# Patient Record
Sex: Female | Born: 1935 | Race: White | Hispanic: No
Health system: Southern US, Community
[De-identification: ages and names within clinical notes are randomized; demographics above are authoritative.]

## PROBLEM LIST (undated history)

## (undated) DIAGNOSIS — F0390 Unspecified dementia without behavioral disturbance: Secondary | ICD-10-CM

## (undated) HISTORY — PX: BRAIN SURGERY: SHX531

---

## 1997-09-10 ENCOUNTER — Ambulatory Visit (HOSPITAL_COMMUNITY): Admission: RE | Admit: 1997-09-10 | Discharge: 1997-09-11 | Payer: Self-pay | Admitting: Ophthalmology

## 1998-01-07 ENCOUNTER — Ambulatory Visit (HOSPITAL_COMMUNITY): Admission: RE | Admit: 1998-01-07 | Discharge: 1998-01-07 | Payer: Self-pay | Admitting: Ophthalmology

## 2000-05-18 ENCOUNTER — Ambulatory Visit (HOSPITAL_COMMUNITY): Admission: RE | Admit: 2000-05-18 | Discharge: 2000-05-18 | Payer: Self-pay | Admitting: Gastroenterology

## 2000-05-19 ENCOUNTER — Ambulatory Visit (HOSPITAL_COMMUNITY): Admission: RE | Admit: 2000-05-19 | Discharge: 2000-05-19 | Payer: Self-pay | Admitting: Gastroenterology

## 2000-05-19 ENCOUNTER — Encounter: Payer: Self-pay | Admitting: Gastroenterology

## 2000-10-17 ENCOUNTER — Other Ambulatory Visit: Admission: RE | Admit: 2000-10-17 | Discharge: 2000-10-17 | Payer: Self-pay | Admitting: *Deleted

## 2002-04-23 ENCOUNTER — Ambulatory Visit (HOSPITAL_COMMUNITY): Admission: RE | Admit: 2002-04-23 | Discharge: 2002-04-23 | Payer: Self-pay | Admitting: Cardiology

## 2003-05-07 ENCOUNTER — Ambulatory Visit (HOSPITAL_COMMUNITY): Admission: RE | Admit: 2003-05-07 | Discharge: 2003-05-07 | Payer: Self-pay | Admitting: Family Medicine

## 2004-07-23 ENCOUNTER — Ambulatory Visit: Payer: Self-pay | Admitting: Cardiology

## 2006-03-16 ENCOUNTER — Ambulatory Visit: Payer: Self-pay | Admitting: Cardiology

## 2006-03-24 ENCOUNTER — Ambulatory Visit (HOSPITAL_COMMUNITY): Admission: RE | Admit: 2006-03-24 | Discharge: 2006-03-24 | Payer: Self-pay | Admitting: Family Medicine

## 2006-04-01 ENCOUNTER — Ambulatory Visit: Payer: Self-pay

## 2007-02-16 ENCOUNTER — Inpatient Hospital Stay (HOSPITAL_COMMUNITY): Admission: EM | Admit: 2007-02-16 | Discharge: 2007-02-21 | Payer: Self-pay | Admitting: Emergency Medicine

## 2007-03-03 ENCOUNTER — Encounter: Admission: RE | Admit: 2007-03-03 | Discharge: 2007-03-03 | Payer: Self-pay | Admitting: Neurosurgery

## 2007-03-07 ENCOUNTER — Inpatient Hospital Stay (HOSPITAL_COMMUNITY): Admission: RE | Admit: 2007-03-07 | Discharge: 2007-03-21 | Payer: Self-pay | Admitting: Neurosurgery

## 2007-03-20 ENCOUNTER — Ambulatory Visit: Payer: Self-pay | Admitting: Physical Medicine & Rehabilitation

## 2007-03-21 ENCOUNTER — Inpatient Hospital Stay (HOSPITAL_COMMUNITY)
Admission: RE | Admit: 2007-03-21 | Discharge: 2007-03-31 | Payer: Self-pay | Admitting: Physical Medicine & Rehabilitation

## 2007-04-17 ENCOUNTER — Emergency Department (HOSPITAL_COMMUNITY): Admission: EM | Admit: 2007-04-17 | Discharge: 2007-04-17 | Payer: Self-pay | Admitting: Emergency Medicine

## 2007-05-02 ENCOUNTER — Encounter: Admission: RE | Admit: 2007-05-02 | Discharge: 2007-05-02 | Payer: Self-pay | Admitting: Neurosurgery

## 2007-07-20 ENCOUNTER — Ambulatory Visit (HOSPITAL_COMMUNITY): Admission: RE | Admit: 2007-07-20 | Discharge: 2007-07-20 | Payer: Self-pay | Admitting: Gastroenterology

## 2007-07-20 ENCOUNTER — Encounter (INDEPENDENT_AMBULATORY_CARE_PROVIDER_SITE_OTHER): Payer: Self-pay | Admitting: Gastroenterology

## 2008-02-21 IMAGING — CT CT HEAD W/O CM
1 of 2 series · 15 of 30 positions shown, 19 images · IV contrast (agent unspecified)
Comparison: CT 03/14/07.

CLINICAL DATA: Followup subdural hemorrhage. Headache.
 HEAD CT WITHOUT CONTRAST:
TECHNIQUE: Contiguous axial images were obtained from the base of the skull through the vertex according to standard protocol without contrast.

[Series 3: recon 2: brain · axial · 0.47mm/px · z∈[+168,+321]mm · 15 of 64 slices shown, 19 images]
[im 4/64  brain]
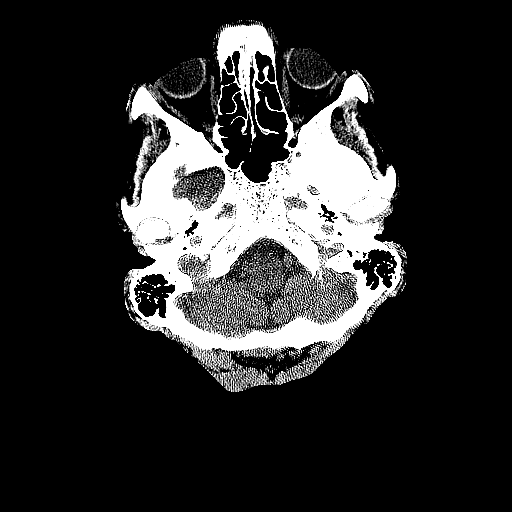
[im 4/64  bone]
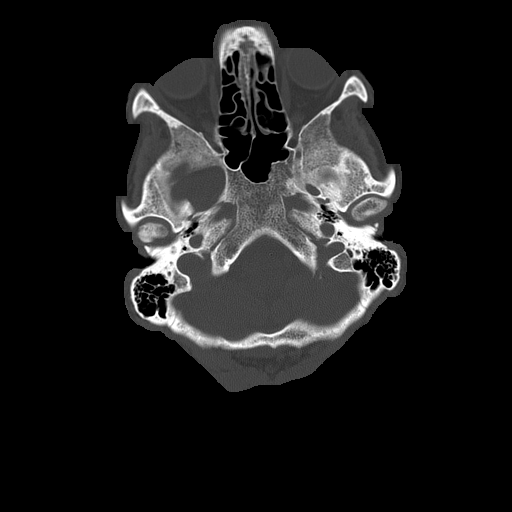
[im 7/64  brain]
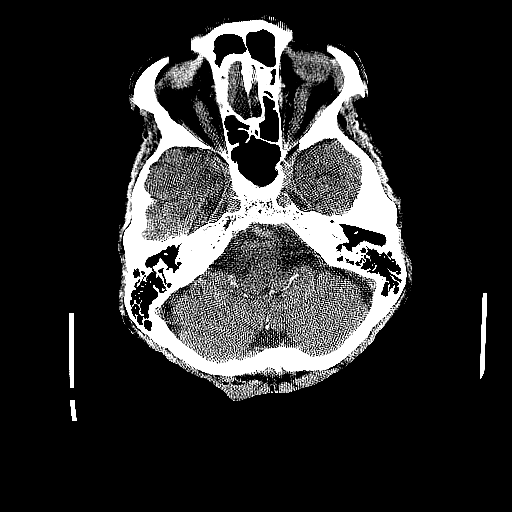
[im 14/64  brain]
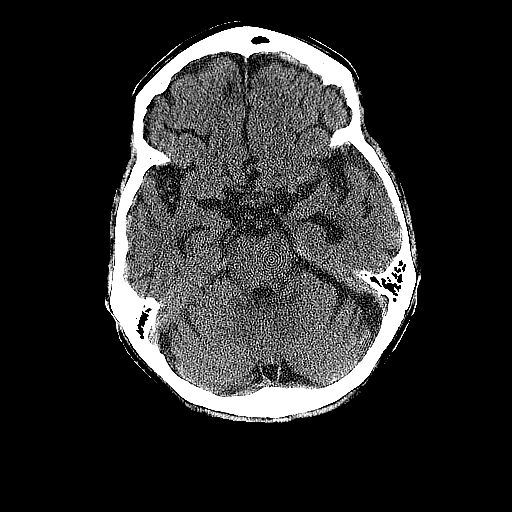
[im 17/64  brain]
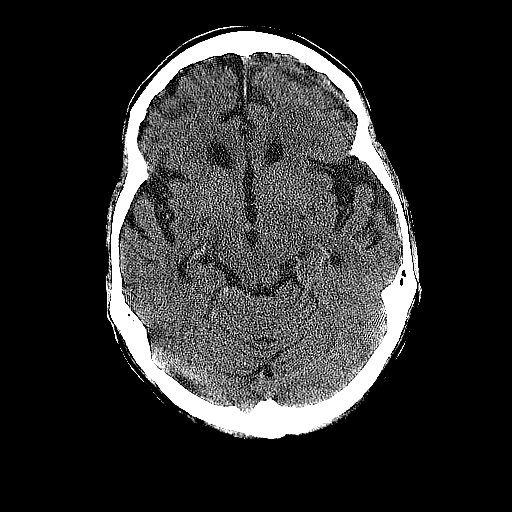
[im 20/64  brain]
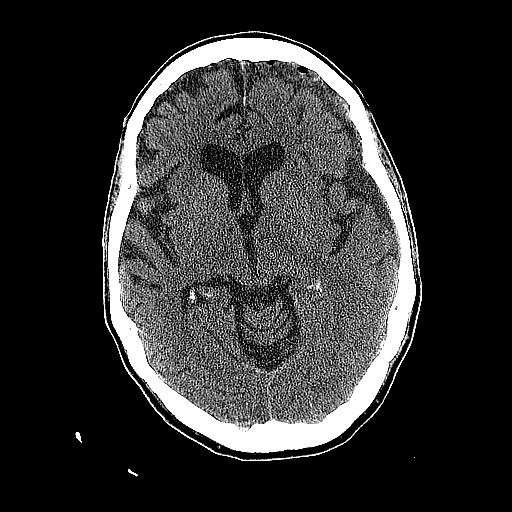
[im 20/64  bone]
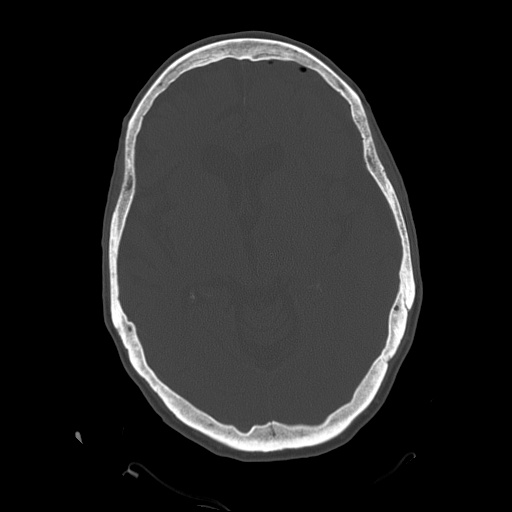
[im 24/64  brain]
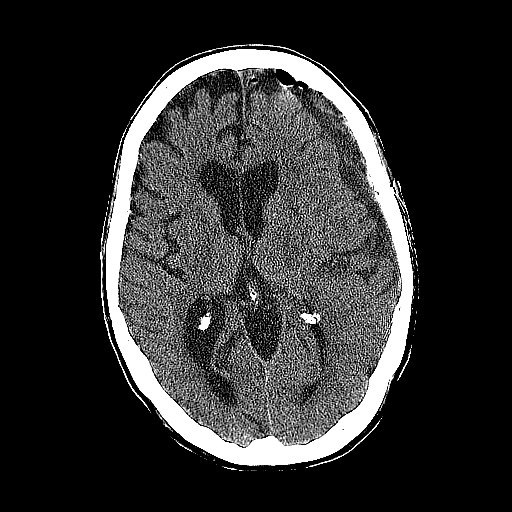
[im 27/64  brain]
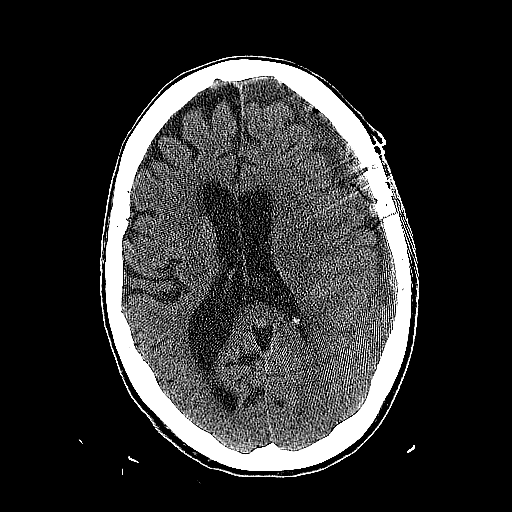
[im 34/64  brain]
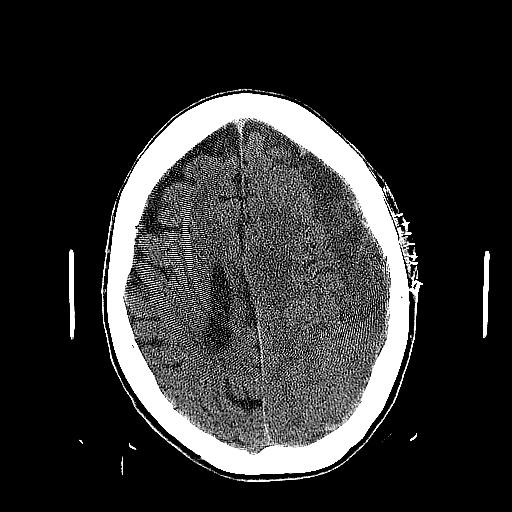
[im 37/64  brain]
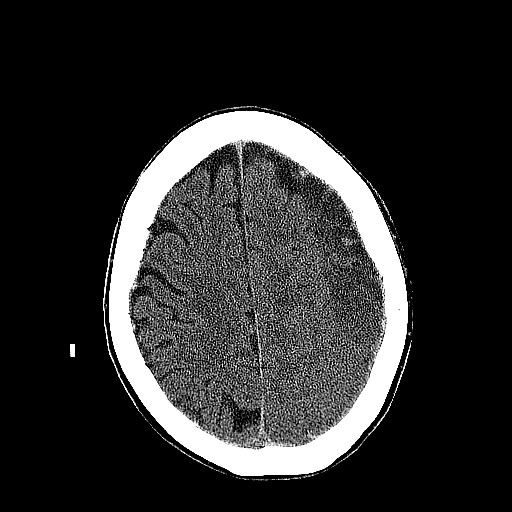
[im 37/64  bone]
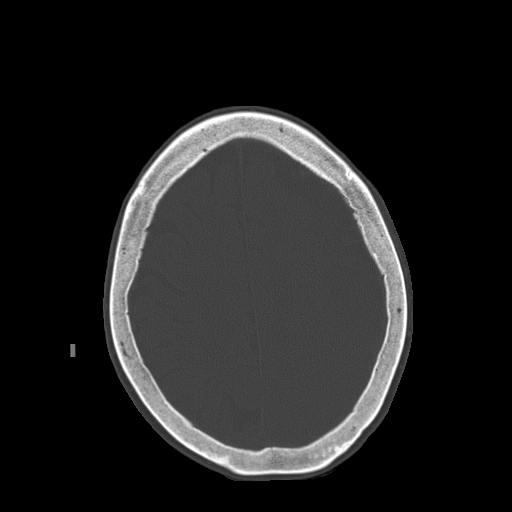
[im 40/64  brain]
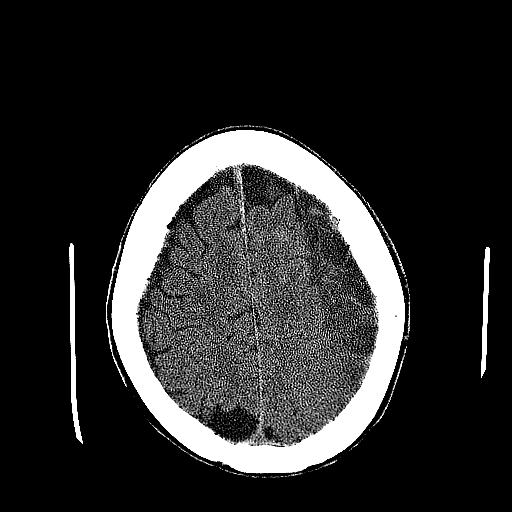
[im 44/64  brain]
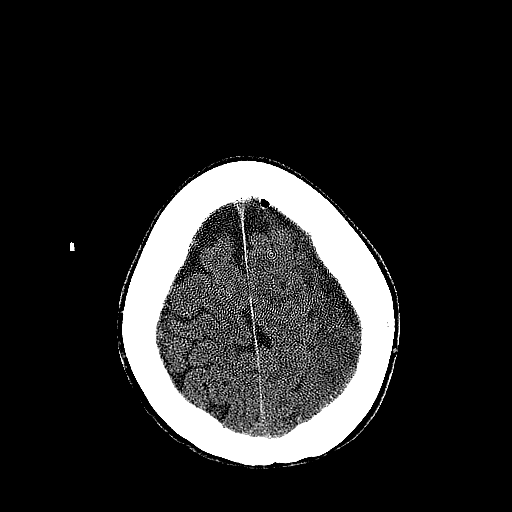
[im 47/64  brain]
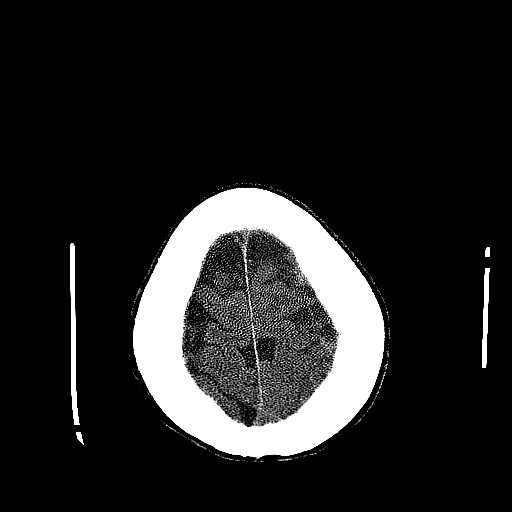
[im 54/64  brain]
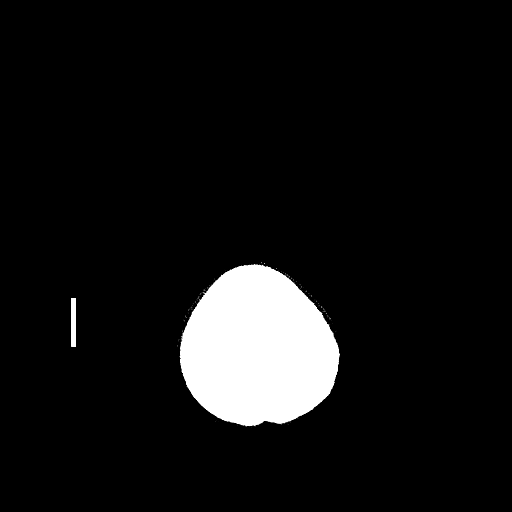
[im 54/64  bone]
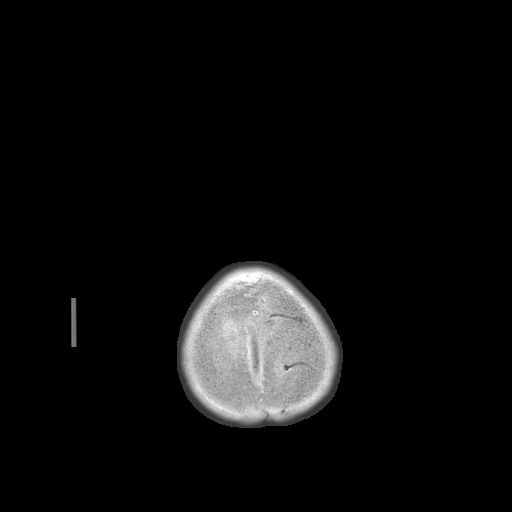
[im 57/64  brain]
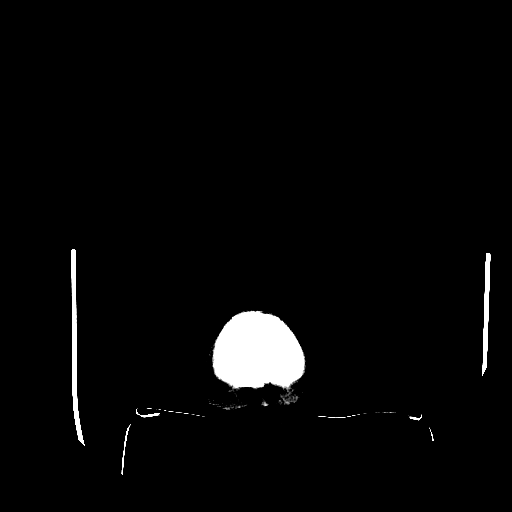
[im 60/64  brain]
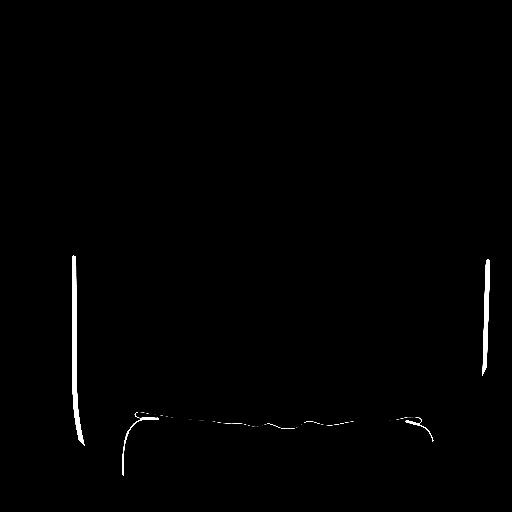

[15 of 30 positions shown; findings below may reference images not displayed]

FINDINGS: There has been left frontal craniotomy for evacuation of subdural hematoma.  There remains a large mixed density subdural hematoma in the left frontal and parietal lobe. This has predominantly low density anteriorly and has some layering higher density blood posteriorly. This fluid collection measures approximately 22 mm in thickness, which is similar to the prior study. There is 6.1 mm of midline shift, which is also similar to the prior study.  No new hemorrhage is seen since the prior CT scan.  There is generalized atrophy, the ventricles are not enlarged. No acute cortical infarct is identified. The prior study showed some acute hemorrhage within the subdural collection, this has resolved.
IMPRESSION: Large mixed density subdural hematoma on the left with 6 mm of midline shift. There is no acute hemorrhage or acute infarct.

## 2008-02-29 IMAGING — CT CT HEAD W/O CM
1 series · 15 of 30 positions shown, 19 images · IV contrast (agent unspecified)
Comparison: 03/21/07.

CLINICAL DATA: History of subdural hematoma; status-post craniotomy.  Increase in lethargy.  
HEAD CT WITHOUT CONTRAST:
TECHNIQUE: Contiguous axial images were obtained from the base of the skull through the vertex according to standard protocol without contrast.

[Series 2: brain · axial · 0.49mm/px · z∈[+115,+261]mm · 15 of 32 slices shown, 19 images]
[im 2/32  brain]
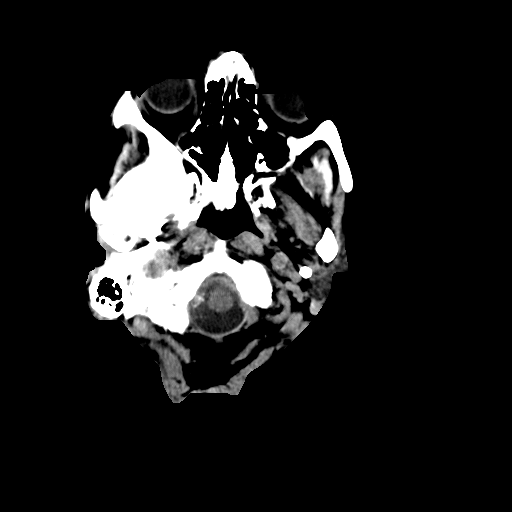
[im 2/32  bone]
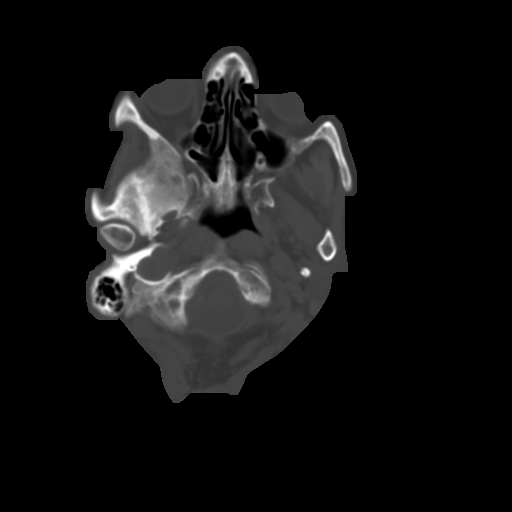
[im 4/32  brain]
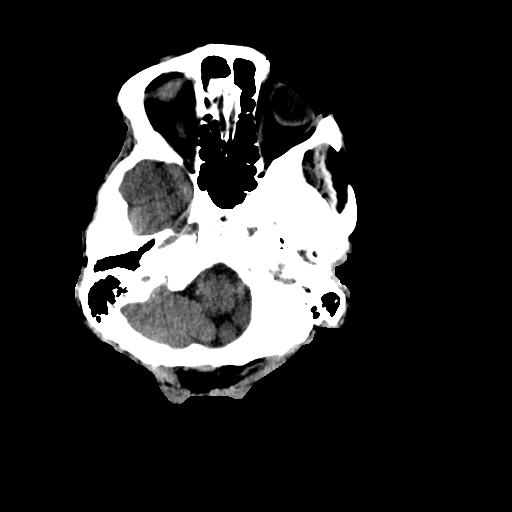
[im 6/32  brain]
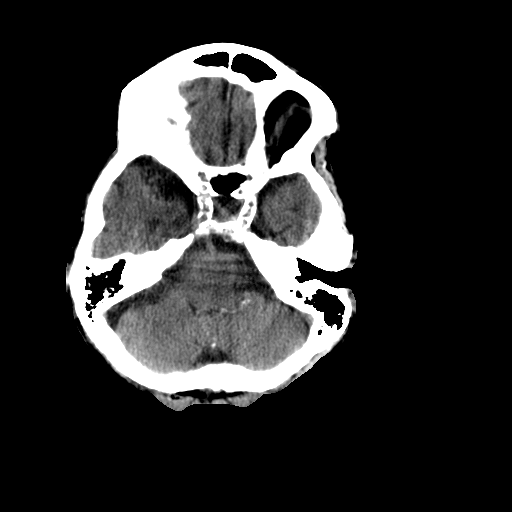
[im 8/32  brain]
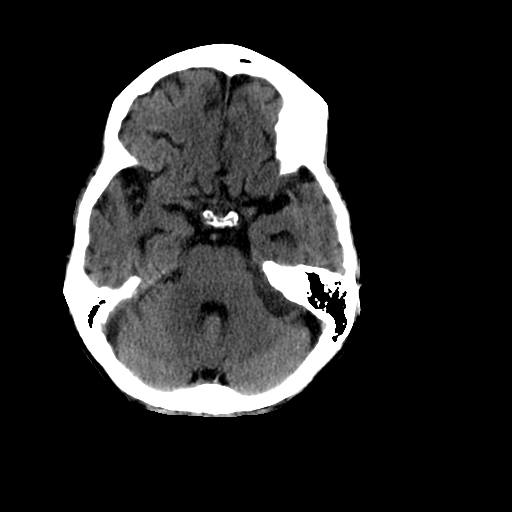
[im 10/32  brain]
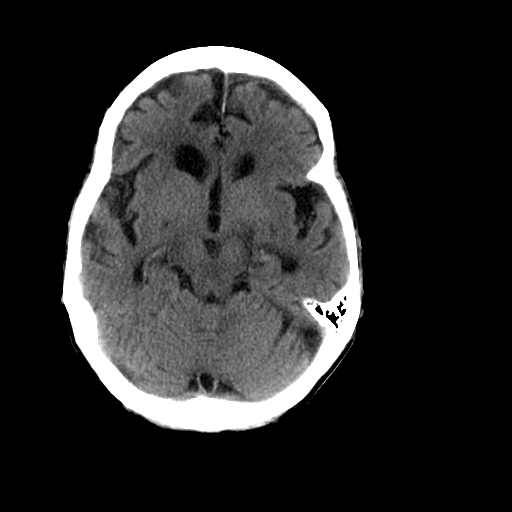
[im 10/32  bone]
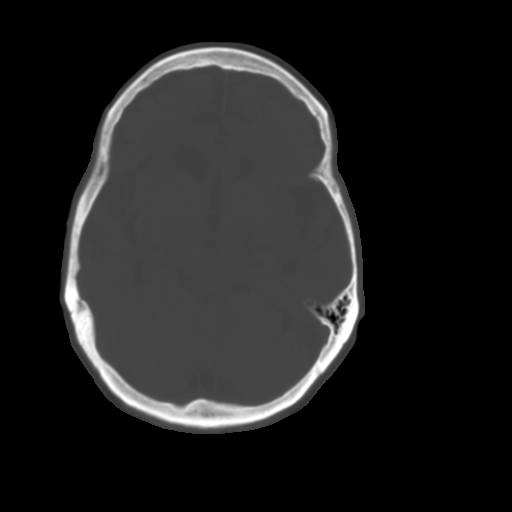
[im 12/32  brain]
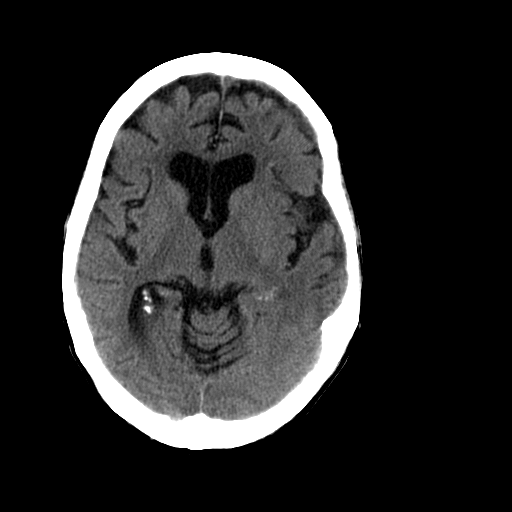
[im 14/32  brain]
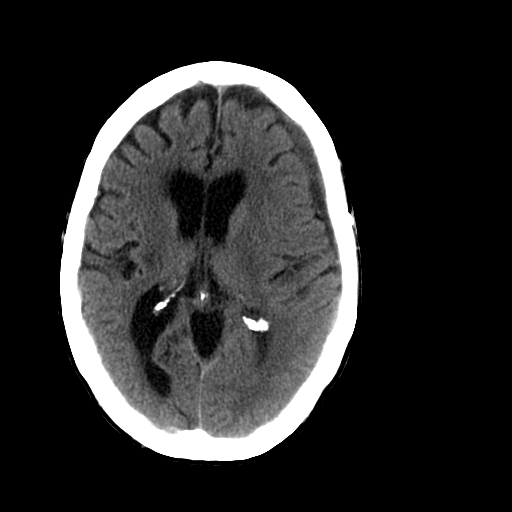
[im 17/32  brain]
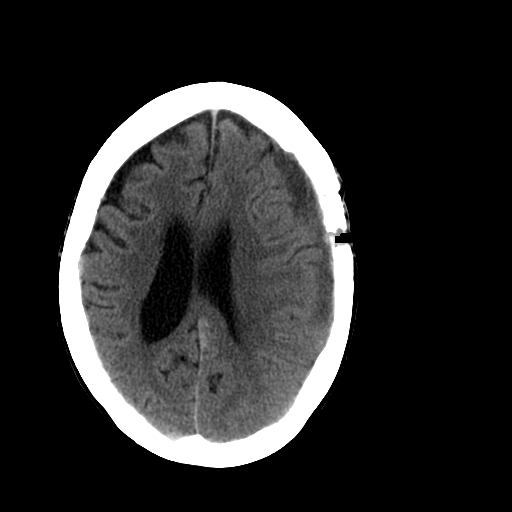
[im 18/32  brain]
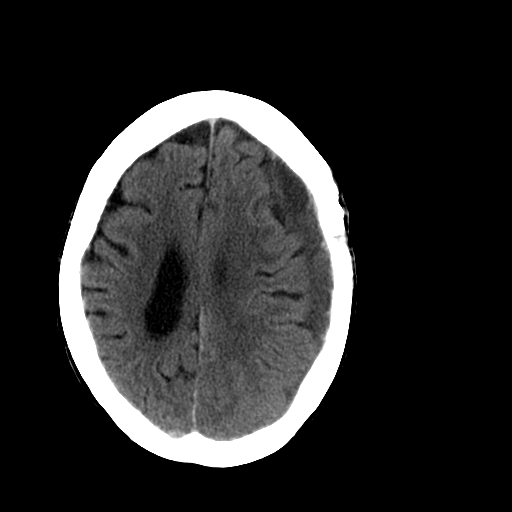
[im 18/32  bone]
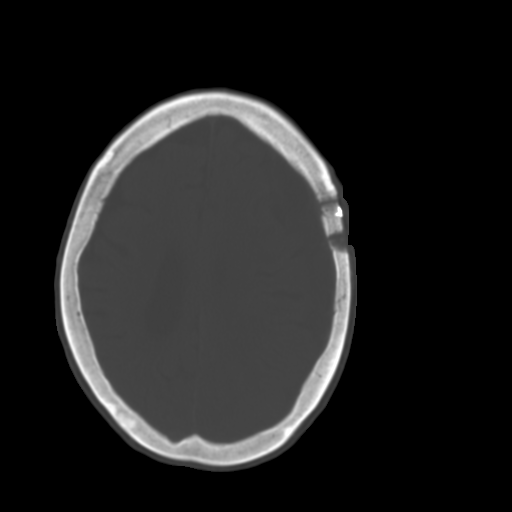
[im 20/32  brain]
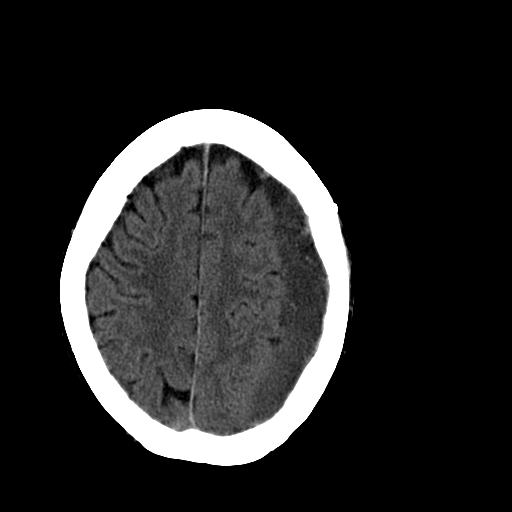
[im 22/32  brain]
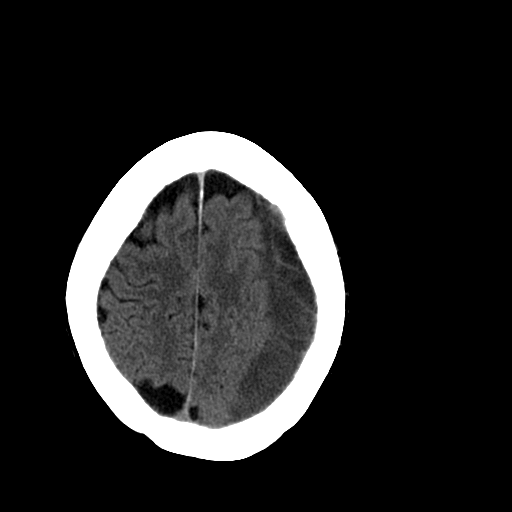
[im 24/32  brain]
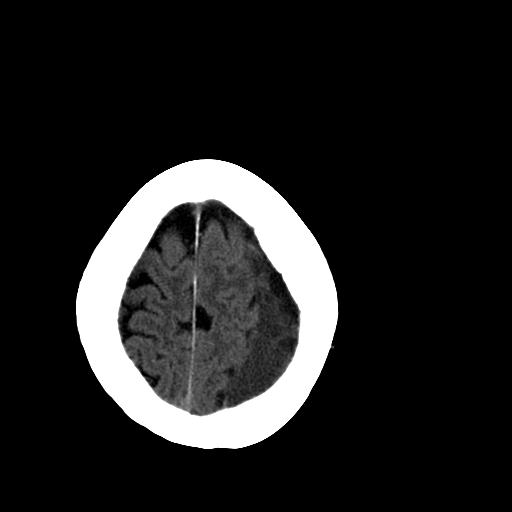
[im 26/32  brain]
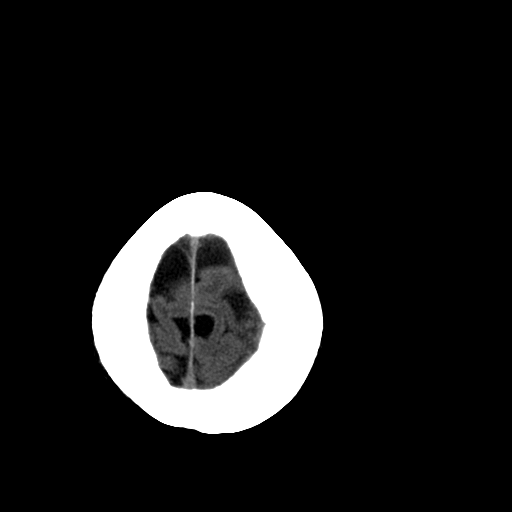
[im 26/32  bone]
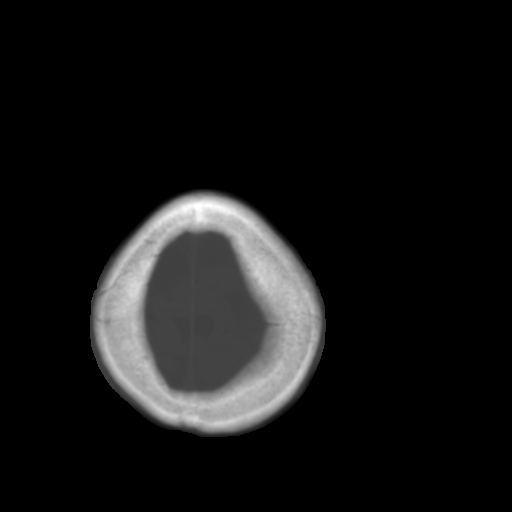
[im 28/32  brain]
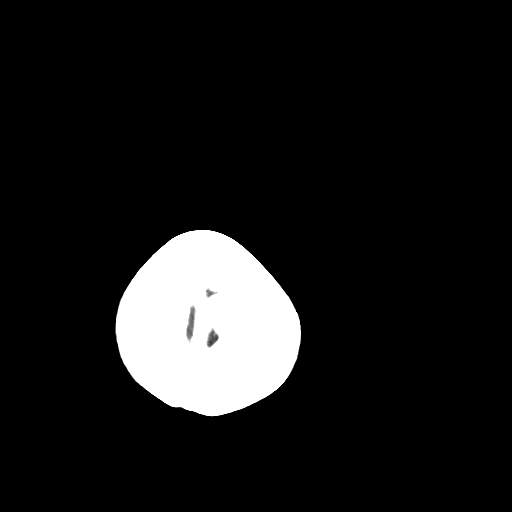
[im 30/32  brain]
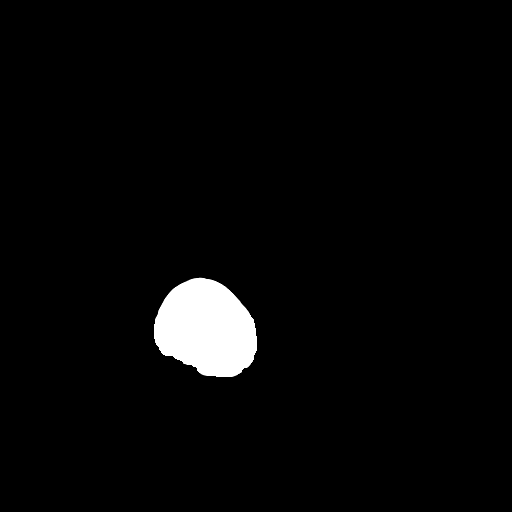

[15 of 30 positions shown; findings below may reference images not displayed]

FINDINGS: Large left-sided subdural hematoma remains with maximal thickness of 2.2 cm with local mass effect upon the adjacent gyri and left lateral ventricle with 5 mm of midline shift towards the right.  The left-sided subdural hematoma has changed in overall consistency which may represent loculation/coagulation of blood.  Superimposed infection cannot be excluded if of clinical concern.  No interval developing large infarct.  Baseline atrophy and small vessel disease-type changes noted.
IMPRESSION: The overall maximal thickness of large left-sided subdural hematoma is similar to that of prior exam measuring 2.2 cm in maximal thickness.  There has been a change in the consistency of the large left-sided hematoma which may represent loculation/coagulation of blood.  Local mass effect with midline shift to the right by 5 mm also similar to that of prior exam.

## 2008-07-18 ENCOUNTER — Observation Stay (HOSPITAL_COMMUNITY): Admission: EM | Admit: 2008-07-18 | Discharge: 2008-07-25 | Payer: Self-pay | Admitting: Emergency Medicine

## 2008-07-18 ENCOUNTER — Ambulatory Visit: Payer: Self-pay | Admitting: Internal Medicine

## 2010-07-29 LAB — DIFFERENTIAL
Basophils Relative: 0 % (ref 0–1)
Eosinophils Relative: 0 % (ref 0–5)
Lymphs Abs: 1.3 10*3/uL (ref 0.7–4.0)
Monocytes Relative: 6 % (ref 3–12)
Neutro Abs: 8.1 10*3/uL — ABNORMAL HIGH (ref 1.7–7.7)
Neutrophils Relative %: 81 % — ABNORMAL HIGH (ref 43–77)

## 2010-07-29 LAB — IRON AND TIBC: UIBC: 241 ug/dL

## 2010-07-29 LAB — COMPREHENSIVE METABOLIC PANEL
ALT: 13 U/L (ref 0–35)
AST: 25 U/L (ref 0–37)
Albumin: 3.2 g/dL — ABNORMAL LOW (ref 3.5–5.2)
Alkaline Phosphatase: 57 U/L (ref 39–117)
Alkaline Phosphatase: 75 U/L (ref 39–117)
BUN: 11 mg/dL (ref 6–23)
CO2: 21 mEq/L (ref 19–32)
Chloride: 104 mEq/L (ref 96–112)
Chloride: 107 mEq/L (ref 96–112)
GFR calc Af Amer: >60 mL/min (ref >60)
GFR calc non Af Amer: >60 mL/min (ref >60)
Glucose, Bld: 89 mg/dL (ref 70–99)
Potassium: 3 mEq/L — ABNORMAL LOW (ref 3.5–5.1)
Potassium: 3.1 mEq/L — ABNORMAL LOW (ref 3.5–5.1)
Total Bilirubin: 0.7 mg/dL (ref 0.3–1.2)
Total Bilirubin: 1.3 mg/dL — ABNORMAL HIGH (ref 0.3–1.2)

## 2010-07-29 LAB — URINALYSIS, ROUTINE W REFLEX MICROSCOPIC
Hgb urine dipstick: NEGATIVE
Ketones, ur: >80 mg/dL — AB
Nitrite: NEGATIVE
Nitrite: POSITIVE — AB
Urobilinogen, UA: 0.2 mg/dL (ref 0.0–1.0)
Urobilinogen, UA: 1 mg/dL (ref 0.0–1.0)
pH: 5.5 (ref 5.0–8.0)

## 2010-07-29 LAB — BLOOD GAS, ARTERIAL
Acid-base deficit: 1.6 mmol/L (ref 0.0–2.0)
Bicarbonate: 21.7 mEq/L (ref 20.0–24.0)
O2 Saturation: 89.3 %
pCO2 arterial: 30.7 mmHg — ABNORMAL LOW (ref 35.0–45.0)
pO2, Arterial: 53.8 mmHg — ABNORMAL LOW (ref 80.0–100.0)

## 2010-07-29 LAB — CBC
HCT: 33.1 % — ABNORMAL LOW (ref 36.0–46.0)
HCT: 33.6 % — ABNORMAL LOW (ref 36.0–46.0)
HCT: 35.1 % — ABNORMAL LOW (ref 36.0–46.0)
HCT: 35.7 % — ABNORMAL LOW (ref 36.0–46.0)
Hemoglobin: 11.3 g/dL — ABNORMAL LOW (ref 12.0–15.0)
Hemoglobin: 11.5 g/dL — ABNORMAL LOW (ref 12.0–15.0)
Hemoglobin: 12.3 g/dL (ref 12.0–15.0)
MCHC: 34.3 g/dL (ref 30.0–36.0)
Platelets: 133 10*3/uL — ABNORMAL LOW (ref 150–400)
Platelets: 145 10*3/uL — ABNORMAL LOW (ref 150–400)
Platelets: 155 10*3/uL (ref 150–400)
Platelets: 207 10*3/uL (ref 150–400)
RBC: 2.82 MIL/uL — ABNORMAL LOW (ref 3.87–5.11)
RBC: 3 MIL/uL — ABNORMAL LOW (ref 3.87–5.11)
RBC: 3.15 MIL/uL — ABNORMAL LOW (ref 3.87–5.11)
RBC: 3.22 MIL/uL — ABNORMAL LOW (ref 3.87–5.11)
RDW: 12.9 % (ref 11.5–15.5)
RDW: 13.5 % (ref 11.5–15.5)
RDW: 13.5 % (ref 11.5–15.5)
WBC: 10.1 10*3/uL (ref 4.0–10.5)
WBC: 3.9 10*3/uL — ABNORMAL LOW (ref 4.0–10.5)
WBC: 5.5 10*3/uL (ref 4.0–10.5)
WBC: 7.1 10*3/uL (ref 4.0–10.5)

## 2010-07-29 LAB — URINE MICROSCOPIC-ADD ON

## 2010-07-29 LAB — MAGNESIUM: Magnesium: 2.1 mg/dL (ref 1.5–2.5)

## 2010-07-29 LAB — RETICULOCYTES: RBC.: 2.9 MIL/uL — ABNORMAL LOW (ref 3.87–5.11)

## 2010-07-29 LAB — BASIC METABOLIC PANEL
BUN: 17 mg/dL (ref 6–23)
BUN: 3 mg/dL — ABNORMAL LOW (ref 6–23)
CO2: 23 mEq/L (ref 19–32)
CO2: 23 mEq/L (ref 19–32)
Calcium: 8.2 mg/dL — ABNORMAL LOW (ref 8.4–10.5)
Calcium: 9 mg/dL (ref 8.4–10.5)
Chloride: 112 mEq/L (ref 96–112)
Creatinine, Ser: 0.85 mg/dL (ref 0.4–1.2)
GFR calc Af Amer: >60 mL/min (ref >60)
GFR calc non Af Amer: >60 mL/min (ref >60)
GFR calc non Af Amer: >60 mL/min (ref >60)
GFR calc non Af Amer: >60 mL/min (ref >60)
GFR calc non Af Amer: >60 mL/min (ref >60)
Glucose, Bld: 179 mg/dL — ABNORMAL HIGH (ref 70–99)
Glucose, Bld: 93 mg/dL (ref 70–99)
Potassium: 2.8 mEq/L — ABNORMAL LOW (ref 3.5–5.1)
Potassium: 3.5 mEq/L (ref 3.5–5.1)
Potassium: 3.6 mEq/L (ref 3.5–5.1)
Sodium: 142 mEq/L (ref 135–145)
Sodium: 142 mEq/L (ref 135–145)
Sodium: 145 mEq/L (ref 135–145)

## 2010-07-29 LAB — CULTURE, BLOOD (ROUTINE X 2)

## 2010-07-29 LAB — CARDIAC PANEL(CRET KIN+CKTOT+MB+TROPI)
Relative Index: 1.5 (ref 0.0–2.5)
Total CK: 153 U/L (ref 7–177)
Total CK: 216 U/L — ABNORMAL HIGH (ref 7–177)
Troponin I: <0.01 ng/mL (ref 0.00–0.06)
Troponin I: <0.01 ng/mL (ref 0.00–0.06)

## 2010-07-29 LAB — URINE CULTURE: Colony Count: >=100000

## 2010-07-29 LAB — AMMONIA: Ammonia: 28 umol/L (ref 11–35)

## 2010-07-29 LAB — HEPATIC FUNCTION PANEL
AST: 19 U/L (ref 0–37)
Albumin: 3.1 g/dL — ABNORMAL LOW (ref 3.5–5.2)
Alkaline Phosphatase: 54 U/L (ref 39–117)
Total Protein: 5.6 g/dL — ABNORMAL LOW (ref 6.0–8.3)

## 2010-07-29 LAB — APTT: aPTT: 38 seconds — ABNORMAL HIGH (ref 24–37)

## 2010-07-29 LAB — FOLATE RBC: RBC Folate: 575 ng/mL (ref 180–600)

## 2010-07-29 LAB — TSH: TSH: 1.643 u[IU]/mL (ref 0.350–4.500)

## 2010-07-29 LAB — T4, FREE: Free T4: 1.08 ng/dL (ref 0.89–1.80)

## 2010-07-29 LAB — VITAMIN B12: Vitamin B-12: 261 pg/mL (ref 211–911)

## 2010-07-29 LAB — PHOSPHORUS: Phosphorus: 2.6 mg/dL (ref 2.3–4.6)

## 2010-07-29 LAB — RPR: RPR Ser Ql: NONREACTIVE

## 2010-07-29 LAB — PROTIME-INR: INR: 1.1 (ref 0.00–1.49)

## 2010-09-01 NOTE — H&P (Signed)
[name], SCHOENBECK              ACCOUNT NO.:  [id number]   MEDICAL RECORD NO.:  [id number]          PATIENT TYPE:  INP   LOCATION:  5524                         FACILITY:  [facility]   PHYSICIAN:  Valerie A. Felicity Coyer, MDDATE OF BIRTH:  1919-11-06   DATE OF ADMISSION:  07/18/2008  DATE OF DISCHARGE:                              HISTORY & PHYSICAL   CHIEF COMPLAINT:  Weakness in bilateral legs, grogginess, and low  blood pressure by EMS.   HISTORY OF PRESENT ILLNESS:  This is an 77 year old white woman with  past medical history of hypertension, AFib, rheumatoid arthritis,  hypothyroidism, and history of head trauma with the sequence subdural  hematoma, status post evacuation via craniotomy in November 2008,  presenting with bilateral leg weakness, and sleepiness.  The weakness  started this a.m. and was witnessed by family.  The patient was started  on Ambien yesterday night for help with her sleep.  She was given half  of her 5 mg tablet.  She usually can walk by herself with minimal help  and with a walker, but this a.m., she was very weak, could not  coordinate moving her legs, and seemed groggy.  She has no other  symptoms.  Of note, her last CT of the head in January 2009, her  subdural hematoma had decreased in size and there was no midline shift.  The patient is full code, per discussion with the family.   PAST MEDICAL HISTORY:  1. Hypertension.  2. AFib.  3. Organic brain syndrome, status post traumatic left subdural      hematoma, status post craniotomy in November 2008.  4. Dementia (Alzheimer).  5. History of psychosis.  6. Rheumatoid arthritis.  7. Hypothyroidism.  8. GERD.  9. Hyperlipidemia.  10.History of cholecystectomy.  11.Status post hysterectomy.   MEDICATIONS AT HOME:  1. Protonix 40 mg p.o. daily.  2. Ambien 2.5 mg x1 dose last night.  3. Aspirin 81 mg p.o. daily.  4. Tylenol 500 mg p.r.n.  5. Vitamin D - 1000 one tablet p.o. daily.  6. Folic acid  400 mcg p.o. daily.  7. Methotrexate 2.5 mg 4 tablets every week p.o.  8. Potassium chloride 20 mEq p.o. daily.  9. Lasix 20 mg p.o. daily.  10.Levothyroxine 88 mcg p.o. daily.  11.Diltiazem 240 mg p.o. daily.  12.Diazepam 5 mg p.o. b.i.d. p.r.n.  13.Aricept 10 mg p.o. daily.   ALLERGIES:  No known drug allergies.   SOCIAL HISTORY:  She lives alone, but has 24/7 hour care.  She has no  smoking, no alcohol history.   REVIEW OF SYSTEMS:  Positive for weakness, grogginess as per HPI, and  pain in the left shoulder.  She also had sinus congestion yesterday and  some sore throat.  She has no chest pain, shortness of breath, nausea,  vomiting, diarrhea, or constipation.   PHYSICAL EXAMINATION:  VITAL SIGNS:  Temperature 97.6, blood pressure  118/59, (of note, per EMS, her systolic blood pressure was 88 at the  time of their arrival, which corrected with 400 mL of normal saline.)  pulse 65, respiratory rate 20, oxygen  saturation 98% on 2 L nasal  cannula.  GENERAL:  The patient is pale, alert, and oriented x1 (only to name).  PULMONARY:  Bibasilar crackles.  CARDIAC:  Regular rate and rhythm.  Systolic ejection murmur 3/6,  nonradiating to the carotid.  GI:  Soft, nontender, nondistended.  Bowel sounds positive.  EXTREMITIES:  No edema.  NEURO:  Muscle strength 3/5 in lower extremities and 4/5 in upper  extremities symmetrical.   LABORATORY DATA:  White blood count 10.1 with 81% PMNs, hemoglobin 11.3  with an MCV of 104.9, platelets 158.  Sodium 138, potassium 3.7,  chloride 112, CO2 23, BUN 19, creatinine 0.85, glucose 107, calcium 7.9.  Urinalysis, urine was hazy, specific gravity 1.031, protein 30, ketone  15, no leukocyte esterase, or nitrites.  Urine micro shows granular  casts.  Chest x-ray: minimal atelectasis bilateral lung bases.  EKG,  normal sinus rhythm, borderline first-degree AV block, P mitrale,  otherwise normal.   ASSESSMENT AND PLAN:  This is an 77 year old white  woman with a past  medical history of hypertension, atrial fibrillation, rheumatoid  arthritis, hypothyroidism, and history of head trauma, newly started on  Ambien, presenting with lower extremity weakness, and sleepiness.  1. Bilateral leg weakness and sleepiness.  This is most likely      secondary to Ambien, since this was a new medication started last      night. Although the dose was low, it could have had the bigger      impact on this frail, dehydrated, woman.  She also got Valium along      with the Ambien; however, she takes Valium for the last 20 years      per her daughter.  Other possible differential diagnoses:      a.     Sepsis - the patient not looking septic or being febrile.       The chest x-ray is clear.  She has no UTI.      b.     Dehydration.  The patient was very dehydrated with a       systolic blood pressure of 88 per EMS likely secondary to poor       p.o. intake and Lasix use.  This was corrected with fluids.  We       will continue fluids, and she might need to stay off Lasix at d/c.      c.     Arrhythmia.  This is possible, although we have no evidence       of this on the monitor or on EKGs and she is in sinus rhythm.  Her       heart rate is in the 60s.      d.     Enlarging subdural hematoma.  This is possible; however,       much less likely since she is nonfocal and she has an improved CT       at the beginning of the year.      e.     Myocardial infraction.  She complaints of no chest pain or       shortness of breath; however, we will check cardiac enzymes and       BNP and also EKG.      f.     B12 deficiency.  The patient has a macrocytic anemia which       could be caused by B12 deficiency, which can impact the strength  in her lower extremities; however, the patient is on methotrexate       and this is most likely the cause for macrocytosis.  Also, B12       def. would not cause weakness overnight. We will admit for       observation and  for physical therapy.  We will hold the Ambien and       the diazepam.  She should not be on Ambien in outpatient! Diazepam       may be restarted at lower dose and then escalate to baseline dose.       We will hydrate her.  We will rule her out for MI and check anemia       panel, TSH, and electrolytes.  2. Dehydration with hypotension. We will hydrate, hold her diuretics.      We will also hold Diltiazem for now since her pulse and blood      pressure  are low.  However, this can be restarted tomorrow if      these 2 vital sign parameters allow.  We will encourage p.o. intake      and give her regular diet.  3. Anemia, macrocytic.  It can be secondary to B12 deficiency or      folate deficiency.  She is on folate at home; however, she can have      low absorption.  We will check anemia panel, RBC, and folate.  4. Hypertension.  Please see problem #2.  5. Dementia.  Continue Aricept.  6. Hypothyroidism.  Check TSH.  7. Rheumatoid arthritis.  Continue methotrexate and folic acid and      Tylenol p.r.n. for pain.  8. Left shoulder pain.  We will continue Tylenol for pain.  We can      offer hot compresses.  We will await PT evaluation.  9. Prophylaxis: heparin and Protonix.  The case was discussed with Dr.      Felicity Coyer.   Dictating for Dr. Rene Paci.      Carlus Pavlov, M.D.  Electronically Signed      Raenette Rover. Felicity Coyer, MD  Electronically Signed    CG/MEDQ  D:  07/18/2008  T:  07/19/2008  Job:  [id number]

## 2010-09-01 NOTE — Discharge Summary (Signed)
[name], [name]              ACCOUNT NO.:  [id number]   MEDICAL RECORD NO.:  [id number]          PATIENT TYPE:  INP   LOCATION:  3020                         FACILITY:  [facility]   PHYSICIAN:  Isidor Holts, M.D.  DATE OF BIRTH:  1919/09/21   DATE OF ADMISSION:  02/16/2007  DATE OF DISCHARGE:  02/20/2007                               DISCHARGE SUMMARY   PRIMARY CARE PHYSICIAN:  [name], M.D.   PRIMARY CARDIOLOGIST:  [name], M.D., [facility]   PRIMARY RHEUMATOLOGIST:  Lemmie Evens, M.D.   DISCHARGE DIAGNOSES:  1. Left subdural hematoma secondary to fall approximately 3 weeks      prior.  2. Benign positional vertigo.  3. Remote history of atrial fibrillation.  4. Hypertension.  5. Dyslipidemia.  6. Hypothyroidism.  7. Gastroesophageal reflux disease.  8. Osteoarthritis.  9. Dementia.   DISCHARGE MEDICATIONS:  1. Cardizem CD 240 mg p.o. daily.  2. Methotrexate 10 mg p.o. weekly.  3. Hydrochlorothiazide 12.5 mg p.o. daily.  4. Levothyroxine 88 mcg p.o. daily.  5. Aricept 10 mg p.o. daily.  6. Pravachol 80 mg p.o. q.h.s.  7. Protonix 40 mg p.o. daily.  8. Lidoderm 5% patch, 1 patch applied to lower back daily.  9. Multivitamin 1 p.o. daily.  10.K-Dur 20 mEq p.o. daily.  11.Meclizine 12.5 mg p.o. b.i.d.   PROCEDURES:  1. Head CT scan dated February 16, 2007.  This showed large left      subdural hematoma with probable chronic and acute component.  Only      mild mass effect.  No herniation or hydrocephalus.  Also diffuse      parenchymal atrophy.  2. Chest x-ray dated February 16, 2007.  This showed no acute disease      in the chest.  3. Head CT scan dated February 17, 2007.  This showed an intermediate      density subdural hematoma on the left which does not appear any      larger, again measuring maximally 9 mm, left-to-right midline shift      of 3 mm again noted.  There are a few foci of more hyperdense blood      in frontal region on the left than  was seen yesterday, but this was      a very minimal change.   CONSULTATIONS:  Dr. Maeola Harman, neurosurgeon.   ADMISSION HISTORY:  See H&P notes of February 16, 2007. However, in  brief, this is an 77 year old female, with known history of  hypothyroidism, dyslipidemia, remote history of atrial fibrillation in  the 1990s, aortic sclerosis on 2-D echocardiogram in 2004, GERD,  osteoarthritis, dementia, hypertension, previous cholecystectomy, and  hysterectomy, who presents because of severe dizziness.  Reportedly she  had started experiencing vertigo approximately 3-4 weeks ago, which the  family associated with taking an incorrect dose of Gabapentin which had  been prescribed by her primary M.D.  Gabapentin was discontinued at that  time, but the patient has been having vertigo, most pronounced while in  bed at night time.  She had fallen approximately 3 weeks ago.  Initial  imaging study on arrival in the emergency department demonstrated a  large left subdural hematoma.  She was admitted for further evaluation,  investigation, and management.   HOSPITAL COURSE:  Problem 1.  Left subdural hematoma.  For details of  presentation, refer to admission history above.  The patient at the time  of evaluation, had no obvious focal neurologic deficit.  Neurosurgical  consultation was called and was kindly provided by Dr. Maeola Harman who  recommended observation only and serial head CT scans.  Repeat head CT  scan of February 17, 2007 showed no significant interval changes.  The  patient has thereafter remained clinically stable without any neurologic  deficits.  She has been ambulated and as of February 19, 2007 was deemed  clinically stable for discharge with neurosurgical followup in 2 weeks'  time, as well as repeat head CT scan at that time.   Problem 2.  Vertigo.  This appears to be benign positional.  The patient  was managed with low-dose Meclizine, as well as physiotherapy.  It is   anticipated that she will benefit from vestibular rehabilitation under  the auspices of physiotherapist.  This will be set up on an outpatient  basis.   Problem 3.  Hypertension.  The patient's BP at the time of initial  evaluation was significantly elevated at 162/75.  This was managed with  pre-admission dosages of Cardizem CD and Hydrochlorothiazide, with  satisfactory clinical response.  As of February 19, 2007, BP was well-  controlled at 100/60 mmHg.   Problem 4.  History of GERD.  This was managed with proton pump  inhibitor treatment during the course of the patient's hospitalization,  and there were no symptoms referable to this.   Problem 5.  Hypothyroidism.  The patient is currently on Synthroid in  pre-admission dosages.  TSH was euthyroid at 2.737.   Problem 6.  Dyslipidemia.  The patient's lipid profile was as follows:  Total cholesterol 152, triglycerides 48, HDL 60, LDL 82.  This is  reasonable.  The patient's pre-admission Pravachol has been continued.   Problem 7.  Remote history of atrial fibrillation.  The patient  reportedly had atrial fibrillation diagnosed in the 1990s.  At the time  of initial evaluation, she was found to be in sinus rhythm and has  remained so, throughout the course of  her hospitalization.   DISPOSITION:  Provided no acute problems arise in the interim, it is  anticipated that the patient will be discharged on February 20, 2007.   DIET:  Heart healthy.   ACTIVITY:  As tolerated.  Otherwise, per PT/OT.   FOLLOWUP INSTRUCTIONS:  The patient is recommended to follow up with her  primary M.D., Dr. Vernon Prey, within 1-2 weeks of discharge.  Her family  is to call for an appointment.  She is also to follow up with Dr. Maeola Harman, neurosurgeon, in 2 weeks.  Appropriate information has been  supplied.   SPECIAL INSTRUCTIONS:  Arrangements have been put in place for home  health PT/OT as well as vestibular rehabilitation.   Note: The  patient was noted to have a macrocytosis of 102.9-104.  However, hemoglobin was normal at 12.8 on February 19, 2007.  TSH was  normal at 2.737.  B12 was 587.  Folate was over 20.  The patient`s  family is making arrangements to provide the patient with 24-hour help.  It is likely that they may also require home health aide.  This will be  provided upon request.      Isidor Holts, M.D.  Electronically Signed     CO/MEDQ  D:  02/19/2007  T:  02/20/2007  Job:  [id number]   cc:   [name], M.D.  Danae Orleans. Venetia Maxon, M.D.  [name], MD, [facility]  Lemmie Evens, M.D.

## 2010-09-01 NOTE — Discharge Summary (Signed)
NAMECARRON, MCMURRY              ACCOUNT NO.:  [id number]   MEDICAL RECORD NO.:  [id number]          PATIENT TYPE:  INP   LOCATION:  5531                         FACILITY:  [facility]   PHYSICIAN:  Hind I Elsaid, MD      DATE OF BIRTH:  Sep 15, 1919   DATE OF ADMISSION:  07/18/2008  DATE OF DISCHARGE:                               DISCHARGE SUMMARY   DISCHARGE DIAGNOSES:  1. Generalized body weakness felt to be secondary to deconditioning.  2. Failure to thrive.  3. Advanced dementia.  4. History of atrial fibrillation.  The patient on normal sinus      rhythm.  5. Hypertension.  6. Organic brain syndrome, status post traumatic left subdural      hematoma.  7. Hypothyroidism.  8. History of rheumatoid arthritis.  9. Hyperlipidemia.  10.Gastroesophageal reflux disease.  11.Status post cholecystectomy.  12.Status post hysterectomy.  13.Iron-deficiency anemia plus vitamin B12 deficiency anemia.   DISCHARGE MEDICATIONS:  1. Aspirin 81 mg p.o. daily.  2. Protonix 40 mg p.o. daily.  3. Vitamin B 1000 mg 1 tablet daily.  4. Folic acid 400 mcg p.o. daily.  5. Levothyroxine 88 mcg daily.  6. Methotrexate 2.5 mg 4 tabletspo weekly.  7. Ambien 2.5 mg nightly p.r.n.  8. Vitamin B12 1000 mcg p.o. daily.  9. Ferrous sulfate 325 mg p.o. b.i.d.   PROCEDURES:  1. Chest x-ray, minimal atelectasis at baseline.  2. Left shoulder, mild AC joint degenerative.   HISTORY OF PRESENT ILLNESS:  This is an 77 year old female with past  medical history significant for hypertension, AFib, rheumatoid  arthritis, history of head trauma with subsequent subdural hematoma  status post evacuation via craniotomy on November 2008, presenting with  bilateral lower extremity weakness and sleepiness.  The patient  apparently has a history of advanced dementia.  Per family, she was  doing her minimal activity and confused.  The patient admitted to the  hospital to exclude other medical causes for her weakness.   Her cardiac  enzymes were negative.  Urinalysis and cultures were negative.  Chest x-  ray was negative.  All CMP including kidney function and liver function  was in normal range.  PT and OT consulted who had recommended home  health PT versus nursing home.  I did discuss with the family where they  want the patient to be discharged at [facility].  Accordingly, case management  and social work were consulted and diagnosis of deconditioning and  failure to thrive was done.  I did not feel any further workup needed to  this patient.  The patient has anemia workup, which show  evident vitamin B12 and iron-deficiency anemia.  Hemoglobin remained  stable around 11.1 and no further workup needed to be done at this  hospital stay.  The patient will be discharged with vitamin B12, folic  acid, and ferrous sulfate and further workup regarding her anemia can be  addressed as outpatient.      Hind Bosie Helper, MD  Electronically Signed     HIE/MEDQ  D:  07/23/2008  T:  07/24/2008  Job:  [id number]

## 2010-09-01 NOTE — H&P (Signed)
[name], [name] NO.:  [id number]   MEDICAL RECORD NO.:  [id number]          PATIENT TYPE:  EMS   LOCATION:  MAJO                         FACILITY:  [facility]   PHYSICIAN:  Isidor Holts, M.D.  DATE OF BIRTH:  December 05, 1919   DATE OF ADMISSION:  02/16/2007  DATE OF DISCHARGE:                              HISTORY & PHYSICAL   PRIMARY CARE PHYSICIAN:  [name], M.D.   CARDIOLOGIST:  [name], MD, [facility]   RHEUMATOLOGIST:  Lemmie Evens, M.D.   CHIEF COMPLAINT:  Vertigo/unsteadiness for months, worse this a.m., also  fall approximately 3 weeks ago.   HISTORY OF PRESENT ILLNESS:  This is an 77 year old female.  She is  mildly demented.  Has a tendency to ramble;  therefore, history is  supplied in the main by her two daughters who have accompanied her to  the emergency department.  According to them, the patient had originally  been on Gabapentin for a long period of time, presumably prescribed for  insomnia.  However, approximately 4 weeks ago, she had taken more  pills than prescribed by her PMD and since that time has been  complaining of vertigo.  Her PMD discontinued Gabapentin at that time.  However,  over the past 1 month the patient has had episodes of vertigo,  most pronounced while lying flat in bed at the night time and during the  daytime she has noticed some unsteadiness in walking.  Approximately 3  weeks ago, while getting out of bed to go to the bathroom in the night,  she had fallen on  the floor.  According to the patient, she does not  particularly recollect banging her head on any object at that time.  She  eventually, after lying on the floor for awhile, managed to crawl some  way and managed to get herself up.  Since then, however,  unsteadiness  has been progressing.  The patient's son stayed overnight on February 15, 2007 and in a.m. of February 16, 2007, his mother called out to him that  she was experiencing severe  dizziness at about 4 a.m, and was unable to  get out of bed.  Her son went to her, became alarmed, called the EMS who  brought the patient to the emergency department.  The patient denies  headache, denies visual obscurations, denies chest pain or shortness of  breath.   PAST MEDICAL HISTORY:  1. Hypothyroidism.  2. Dyslipidemia.  3. History of atrial fibrillation in the 1990s.  4. Aortic sclerosis on 2-D echocardiogram in 2004.  5. GERD.  6. Osteoarthritis.  7. Dementia.  8. Hypertension.  9. Status post cholecystectomy.  10.Status post hysterectomy.   MEDICATIONS:  1. Cardizem CD 240 mg p.o. daily.  2. Methotrexate 10 mg p.o. q. weekly.  3. Hydrochlorothiazide 12.5 mg p.o. daily.  4. Levothyroxine 88 mcg p.o. daily.  5. Aricept 10 mg p.o. daily.  6. Diazepam 5 mg p.o. p.r.n. b.i.d.  7. Pravachol 80 mg at bedtime.  8. Protonix 40 mg p.o. daily.  9. Lidoderm 5% patch one patch apply to lower  back daily.  10.Multivitamin one p.o. daily.  11.GNC one p.o. daily.  12.Aspercreme apply topically b.i.d. to legs, hips and shoulders.   ALLERGIES:  NO KNOWN DRUG ALLERGIES.   REVIEW OF SYSTEMS:  As per HPI and chief complaint.  The patient denies  abdominal pain, vomiting or diarrhea.  Denies fever or chills.  Denies  dysuria.   SOCIAL HISTORY:  The patient's resides by herself, although her son  comes by and stays with her 5 days a week.  She has a cousin who lives  close by.  Her daughter does her grocery shopping.  The patient lives by  herself, is quite independent, ambulates without a cane.  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  She is a nonsmoker, nondrinker.  Has no history of drug  abuse.  She has seven offspring.   FAMILY HISTORY:  Noncontributory secondary to age.   PHYSICAL EXAMINATION:  VITALS:  Temperature 98.1, pulse 66 per minute,  respiratory rate 19, BP 162/75, pulse oximeter 97% on room air.  GENERAL:  The  patient does not appear to be in obvious acute distress at  the time of this evaluation.  Alert, communicative, not short of breath  at rest.  HEENT:  No clinical pallor or jaundice.  No conjunctival injection.  Throat is clear.  NECK:  Supple.  JVP not seen.  No palpable lymphadenopathy.  No palpable  goiter.  No carotid bruits.  CHEST:  Clinically clear to auscultation.  No wheezes or crackles.  CARDIAC:  Heart sounds S1 and S2 heard,  normal, regular.  Systolic  murmur heard.  ABDOMEN:  Abdomen is full, soft and nontender.  There is no palpable  organomegaly.  No palpable masses.  Normal bowel sounds.  EXTREMITIES:  Lower extremity examination shows no pitting edema.  Palpable peripheral pulses.  MUSCULOSKELETAL:  The patient has noted osteoarthritic changes.  Appears  to have full range of motion all joints.  CENTRAL NERVOUS SYSTEM:  No focal neurologic deficit on gross  examination.   LABORATORY DATA:  CBC shows WBC 4.6, hemoglobin 12.1, hematocrit 35.8,  platelets 250,000.  Electrolytes:  Sodium 144, potassium 3.6, chloride  106, CO2 31.3.  BUN 7, creatinine 0.8, glucose 95, INR is 0.9.  Troponin  I at point of care less than 0.05.  Head CT scan dated February 16, 2007  shows large left subdural hematoma with acute and chronic components,  mild mass effect.  No herniation.  No hydrocephalus.  There is  parenchymal atrophy.   ASSESSMENT/PLAN:  1. Left subdural hematoma.  The patient is neurologically stable.  Has      no obvious focal neurologic deficits.  She has been evaluated in      the emergency department by Dr. Venetia Maxon, neurosurgeon, who has      provided neurosurgical consultation.  His recommendation is to      observe neuro checks, do serial CT scans.  Further management will,      of course, be per neurosurgical recommendations.   1. Uncontrolled hypertension.  We shall continue pre-admission      antihypertensive medications, utilize p.r.n. labetalol.   1. Vertigo.   This may be benign positional, as it antedated fall and      subdural hematoma.  The patient will likely benefit from      physiotherapy.   1. History of prior atrial fibrillation.  Clinically the patient  appears to be in sinus rhythm.  We shall monitor on telemetry.  Do      12-lead EKG, cycle cardiac enzymes, do TSH.   1. History of hypothyroidism.  We shall continue replacement treatment      in pre-admission dosage.   1. GERD.  Continue proton pump inhibitor.   1. History of dementia.  Shall continue Aricept.  Check B12 and folate      levels.   1. Osteoarthritis.  We shall manage with analgesics.   Further management will depend on clinical course.      Isidor Holts, M.D.  Electronically Signed     CO/MEDQ  D:  02/16/2007  T:  02/17/2007  Job:  [id number]   cc:   [name], M.D.

## 2010-09-01 NOTE — Op Note (Signed)
[name], [name]              ACCOUNT NO.:  [id number]   MEDICAL RECORD NO.:  [id number]          PATIENT TYPE:  AMB   LOCATION:  ENDO                         FACILITY:  [facility]   PHYSICIAN:  Petra Kuba, M.D.    DATE OF BIRTH:  1919/06/02   DATE OF PROCEDURE:  07/20/2007  DATE OF DISCHARGE:                               OPERATIVE REPORT   PROCEDURE:  Colonoscopy and biopsy.   INDICATION:  Rectum with guaiac positivity, minimal anemia.  Consent was  signed after risks, benefits, methods, options thoroughly discussed with  both the patient and her daughter on multiple occasions.   MEDICINES USED:  Diprivan 120 mg, fentanyl 50 mcg, no Versed.   PROCEDURE:  Rectal inspection is pertinent for small external  hemorrhoids.  Digital exam was negative.  The video pediatric  colonoscope was inserted and with some difficulty due to a diverticula  filled sigmoid with some tortuosity and inability to hold air with some  abdominal pressure we were able to pass around the colon to the cecum.  No signs of bleeding were seen.  Cecum was identified by the appendiceal  orifice and ileocecal valve.  The scope was slowly withdrawn.  In the  mid descending a questionable lipoma with a little bit of erythema on  the mucosa was seen and was cold biopsied x3.  The scope was further  withdrawn.  No other polypoid lesions, masses, tumors or other  significant abnormalities were seen other than the left sided  diverticula.  The sigmoid was difficult to evaluate due to decreased  ability to hold air but no signs of bleeding or other abnormalities were  seen.  Anorectal pull-through and retroflexion back in the rectum  confirmed some small hemorrhoids.  Scope was straightened, air was  suctioned, scope removed.  The patient tolerated the procedure well.  There was no obvious immediate complication.   ENDOSCOPIC DIAGNOSES:  1. Internal-external small hemorrhoids.  2. Diverticula filled sigmoid and with  some tortuosity and decreased      ability to hold air but no signs of bleeding.  3. Questionable lipoma in the ascending, status post cold biopsy.  4. Otherwise within normal limits to the cecum without any blood being      seen.   PLAN:  Treat hemorrhoids.  Await pathology.  Follow-up p.r.n. or in six  weeks to recheck symptoms possibly guaiacs and consider any further  workup and plans like a one-time endo.           ______________________________  Petra Kuba, M.D.     MEM/MEDQ  D:  07/20/2007  T:  07/20/2007  Job:  [id number]   cc:   [name], M.D.  Fax: [phone]   S. Kyra Manges, M.D.  Fax: [phone]

## 2010-09-01 NOTE — Discharge Summary (Signed)
[name], [name]              ACCOUNT NO.:  [id number]   MEDICAL RECORD NO.:  [id number]          PATIENT TYPE:  IPS   LOCATION:  4003                         FACILITY:  [facility]   PHYSICIAN:  Ranelle Oyster, M.D.DATE OF BIRTH:  18-Mar-1920   DATE OF ADMISSION:  03/21/2007  DATE OF DISCHARGE:  03/31/2007                               DISCHARGE SUMMARY   DISCHARGE DIAGNOSES:  1. Traumatic left subdural hematoma status post left craniotomy      November 18.  2. Hypertension.  3. Dementia.  4. Hypothyroidism.  5. Atrial fibrillation.  6. Escherichia coli urinary tract infection, resolved.  7. Hyperlipidemia.  8. Rheumatoid arthritis.  9. Gastroesophageal reflux disease.   This 77 year old female with history of atrial fibrillation, no Coumadin  therapy, admitted November 18 after recent fall.  Sustained a small left  subdural hematoma.  Monitored for a few days, discharged to home.  Follow-up scan secondary to altered mental status showed enlarged  subdural hematoma with loculated collections measuring 2.5 cm.  Underwent left craniotomy for subdural hematoma November 18 per Dr.  Venetia Maxon.  Follow-up cranial CT scan November 25 with persistent midline  shift, left greater than right, 7 mm, and monitored per neurosurgery.  Placed on Cipro for E-coli urinary tract infection November 25.  Keppra  added for seizure prophylaxis.  No seizure activity noted.   PAST MEDICAL HISTORY:  See discharge diagnoses.  No alcohol or tobacco.   ALLERGIES:  None.   SOCIAL HISTORY:  Lives alone but daughter can assist during the day.  Her son has been staying at night.  They live in a one-level home.   MEDICATIONS PRIOR TO ADMISSION:  1. Cardizem.  2. Methotrexate.  3. Hydrochlorothiazide.  4. Synthroid.  5. Aricept.  6. Pravachol.  7. Protonix.  8. Lidoderm patch.  9. Multivitamin  10.Potassium chloride.  11.Antivert.   REHABILITATION HOSPITAL COURSE:  The patient was admitted to  inpatient  rehab services with therapies initiated on a 3-hour daily basis  consisting of physical therapy, occupational therapy, speech therapy and  rehabilitation nursing.  The following issues were addressed during the  patient's rehabilitation stay.  Pertaining to [name] traumatic left  subdural hematoma, she had undergone left craniotomy November 18.  Follow-up cranial CT scan December 2 showed essentially no change, no  acute hemorrhage noted.  There was still some mixed density subdural  hematoma on the left with a 6-mm midline shift.  Functionally, she was  minimal assistance for mobility with maximum cuing decreased attention  noted.  Minimal assist for activities of daily living.  She was on a  regular diet.  She did have a documented history of dementia.  She  remained on Aricept 10 mg at bedtime, low dose of Risperdal added 1 mg  at bedtime and monitoring restraints were being used for patient's  safety due to limited awareness.  Blood pressures and heart rate  monitored with Cardizem, hydrochlorothiazide, no orthostatic changes  noted.  She would remain on her hormone supplement for hypothyroidism.  She did have a history of rheumatoid arthritis.  She remained on  methotrexate mg weekly.  She was also using a Lidoderm patch for her  pain management related to rheumatoid arthritis.  She had completed a  course of Cipro for a E-coli urinary tract infection.  Routine toileting  was provided.   Latest labs showed a hemoglobin of 11.4, hematocrit 33.5, platelet  310,000.  Sodium 139, potassium 3.9, BUN 12, creatinine 0.7.  Also noted  the patient did have a small area on the right cheek of her face,  questionable basal cell carcinoma.  Daughter had seen Dr. Terri Piedra of  dermatology services in the past.  It was advised she would follow up  with Dr. Terri Piedra as an outpatient basis for questionable excision of this  area.   DISCHARGE MEDICATIONS:  Included:  1. Antivert 12.5 mg  twice daily.  2. Protonix 40 mg daily.  3. Pravachol 80 mg daily.  4. Cardizem CD 240 mg daily.  5. Aricept 10 mg at bedtime.  6. Hydrochlorothiazide 12.5 mg daily.  7. Synthroid 88 mcg daily.  8. Methotrexate 10 mg weekly.  9. Potassium chloride 20 mEq daily.  10.Risperdal 1 mg at bedtime.  11.Lidoderm patch remove every 12 hours.  12.Multivitamin daily.   She would follow with Dr. Faith Rogue at the outpatient rehab service  office, appointment to be made; Dr. Waynard Edwards, medical management; Dr.  Venetia Maxon, neurosurgery, [phone].      [name], P.A.      Ranelle Oyster, M.D.  Electronically Signed    DA/MEDQ  D:  03/30/2007  T:  03/30/2007  Job:  [id number]   cc:   Ranelle Oyster, M.D.  [name], M.D.  Lemmie Evens, M.D.  [facility]

## 2010-09-01 NOTE — Discharge Summary (Signed)
NAMESHONI, QUIJAS              ACCOUNT NO.:  [id number]   MEDICAL RECORD NO.:  [id number]          PATIENT TYPE:  INP   LOCATION:  3032                         FACILITY:  [facility]   PHYSICIAN:  Danae Orleans. Venetia Maxon, M.D.  DATE OF BIRTH:  1919-09-29   DATE OF ADMISSION:  03/07/2007  DATE OF DISCHARGE:  03/21/2007                               DISCHARGE SUMMARY   REASON FOR ADMISSION:  Traumatic subdural hematoma with a fall resulting  in striking head against another object.   ADDITIONAL DIAGNOSES:  1. Hypertension not otherwise specified.  2. Arthropathy not otherwise specified.  3. Hypothyroidism not otherwise specified.  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  5. Esophageal reflux.  6. Diaphragmatic hernia.  7. Cardiac dysrhythmias no essential change.  8. Atrial fibrillation.  9. Psychosis not otherwise specified.  10.Rheumatoid arthritis.   FINAL DIAGNOSES:  1. Hypertension not otherwise specified.  2. Arthropathy not otherwise specified.  3. Hypothyroidism not otherwise specified.  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  5. Esophageal reflux.  6. Diaphragmatic hernia.  7. Cardiac dysrhythmias no essential change.  8. Atrial fibrillation.  9. Psychosis not otherwise specified.  10.Rheumatoid arthritis.   HISTORY OF ILLNESS/HOSPITAL COURSE:  [name] is an 77 year old  woman with a chronic and subacute left subdural hematoma who came back  to the office was developing enlargement of a subdural hematoma which  was now 2.5 cm and 5 mm with left to right shift.  She was developing  pronator drift.  She was therefore admitted to the hospital on March 07, 2007 and underwent craniotomy and evacuation of subdural hematoma.  She had a JP drain placed and continues to have fairly brisk bloody  drainage from this.  Consequently the drain was left in place.  She was  draining CSF on March 13, 2007 and the drain was therefore removed.  The patient had  repeat CT done on March 14, 2007 which shows  persistent left to right shift with new and old blood.  The patient was  mobilized and had the drain discontinued and the Foley catheter  discontinued.  She had some confusion which gradually cleared and she  was actually doing better on March 15, 2007.  On March 20, 2007,  she had  no drift.  She was confused about her location.  She was walking better.  She was felt to be good candidate for inpatient rehabilitation.  Head CT  was stable on March 21, 2007 with improving subdural and less blood.  She was felt to be okay to go to rehab with follow up CT in 2 weeks in  the office with staples to be discontinued.      Danae Orleans. Venetia Maxon, M.D.  Electronically Signed     JDS/MEDQ  D:  04/21/2007  T:  04/21/2007  Job:  [id number]

## 2010-09-01 NOTE — Consult Note (Signed)
NAMEMARRION, FINAN              ACCOUNT NO.:  [id number]   MEDICAL RECORD NO.:  [id number]          PATIENT TYPE:  EMS   LOCATION:  MAJO                         FACILITY:  [facility]   PHYSICIAN:  Danae Orleans. Venetia Maxon, M.D.  DATE OF BIRTH:  10-03-1919   DATE OF CONSULTATION:  02/16/2007  DATE OF DISCHARGE:                                 CONSULTATION   NEUROSURGICAL CONSULTATION:   REASON FOR CONSULTATION:  Left subdural hematoma.   HISTORY OF ILLNESS:  [name] is an 77 year old woman who fell 3  weeks ago and struck her head in the middle of the night while trying to  go to the bathroom.  Yesterday night she had a significant amount of  dizziness for which she came to the emergency room.  She had a head CT  in the emergency room which shows atrophy and the left chronic and  subacute subdural hematoma with minimal mass effect.  The family notes  greater than a 3-week history of dizziness and unsteadiness on her feet.  Dr. Christell Constant is her family practice doctor.  She denies headache, no  history of seizure.   PAST MEDICAL HISTORY:  Significant for:  1. Hypertension.  2. Arthritis.  3. Hypothyroidism.  4. Hard of hearing.  5. She is status post cataract extraction.  6. She has a history of atrial fibrillation in 1990s.  7. Status post cholecystectomy and hysterectomy.   CURRENT MEDICATIONS:  1. Cardizem CD 240 mg daily.  2. Levoxyl 88 mcg daily.  3. Methotrexate 2.5 mg.  4. Hydrochlorothiazide 12.5 mg daily.  5. Gabapentin 300 mg daily.  6. Aricept 10 mg daily.  7. Diazepam 5 mg twice daily as needed.  8. Pravastatin 80 mg at bedtime.  9. Protonix 40 mg once daily.   She is INTOLERANT to DARVOCET.   SOCIAL HISTORY:  She is a nondrinker, nonsmoker, lives at home, has  family in attendance.   PHYSICAL EXAMINATION:  GENERAL:  Ms. Peeters is a pleasant, cooperative,  older woman in no acute distress.  She is awake, alert, conversant, and  oriented x3.  VITAL SIGNS:   Temperature is 98.1, pulse is 60, respiratory rate of 12,  blood pressure of 147/73.  HEENT:  She has hearing aids.  She has no headache.  She has no bruising  over her scalp.  She previously hit the right parietooccipital region  approximately 3 weeks ago.  Pupils are equal, round, and reactive to  light.  Extraocular movements are intact.  Facial motor and sensation  are intact and symmetric.  EXTREMITIES:  She has no pronator drift in her upper extremities.  She  has 5/5 upper and lower extremity strength in all motor groups.  She  complains of cramping in both of her legs, which she says is a chronic  problem.  Reflexes are symmetric in the upper and lower extremities  without pathologic reflexes.   IMPRESSION:  Tecora [name] is an 77 year old woman with a long history  of dizziness and gait unsteadiness with a more recent fall approximately  3 weeks ago and now with a subdural hematoma.  I am not certain as to  whether some of her dizziness may be related to medications or an inner  ear problem as her dizziness seems to predate the recent fall in which  she struck her head.  She is to be admitted to the medicine service.  She will need a followup computed tomography of her head on February 17, 2007.  If this is stable and she is able to walk with assistance in  physical therapy, then it would be all right for her to be discharged  home.  She will need a followup computed tomography scan in  approximately 2 weeks.  If her subdural hematoma enlarges or she becomes  symptomatic, this may need burr-hole drainage, otherwise this can be  managed conservatively.      Danae Orleans. Venetia Maxon, M.D.  Electronically Signed     JDS/MEDQ  D:  02/16/2007  T:  02/16/2007  Job:  [id number]

## 2010-09-01 NOTE — Op Note (Signed)
[name], [name]              ACCOUNT NO.:  [id number]   MEDICAL RECORD NO.:  [id number]          PATIENT TYPE:  INP   LOCATION:  3104                         FACILITY:  [facility]   PHYSICIAN:  Danae Orleans. Venetia Maxon, M.D.  DATE OF BIRTH:  1919-06-04   DATE OF PROCEDURE:  03/07/2007  DATE OF DISCHARGE:                               OPERATIVE REPORT   PREOPERATIVE DIAGNOSIS:  Left subdural hematoma.   POSTOPERATIVE DIAGNOSIS:  Left subdural hematoma.   PROCEDURE:  Left craniotomy for subdural hematoma.   SURGEON:  Danae Orleans. Venetia Maxon, M.D.   ANESTHESIA:  General endotracheal anesthesia.   ESTIMATED BLOOD LOSS:  Minimal.   COMPLICATIONS:  None.   DISPOSITION:  Recovery.   INDICATIONS:  [name] is an 77 year old woman who fell and struck  her head and developed an acute subdural hematoma.  This was fairly  small. She was observed in hospital for couple of days and then  discharged home.  Follow-up scans demonstrated significantly enlarged  subdural hematoma with loculated collections measuring 2.5 cm at its  thickest.  It was elected take her surgery for craniotomy and evacuation  of subdural.   PROCEDURE:  [name] was brought to the operating room.  Following  satisfactory uncomplicated induction of general endotracheal anesthesia  and placement of intravenous lines, Foley catheter, she was placed with  left side bumped on blanket roll.  Right side of her head on a donut  head holder.  Her left frontotemporal parietal scalp was then shaved,  prepped and draped in the usual sterile fashion.  Area of planned  incision was infiltrated with 0.25% Marcaine and 0/5% lidocaine with  1:200,000 epinephrine.  Incision was made, and Raney clips were applied.  About a half-dollar craniotomy flap was then turned, silver dollar size  craniotomy flap was then turned and the dura was opened in cruciate  fashion.  This revealed significant amount of subdural blood.  There was  also  loculated collections and these loculations were opened.  The  membranes were cauterized with bipolar electrocautery.  A #7 JP drain  was then inserted through separate stab incision.  The wound was  irrigated copiously with saline.  The dura  was reapproximated loosely, the bone flap was replaced with Osteomed  plates.  The galea was reapproximated with to 2-0 Vicryl sutures.  Skin  edges were approximated with staples.  Sterile occlusive dressing was  placed.  The patient was taken to recovery having tolerated procedure  well.      Danae Orleans. Venetia Maxon, M.D.  Electronically Signed     JDS/MEDQ  D:  03/07/2007  T:  03/08/2007  Job:  [id number]

## 2010-09-01 NOTE — Discharge Summary (Signed)
[name], [name]              ACCOUNT NO.:  [id number]   MEDICAL RECORD NO.:  [id number]          PATIENT TYPE:  INP   LOCATION:  3020                         FACILITY:  [facility]   PHYSICIAN:  Isidor Holts, M.D.  DATE OF BIRTH:  07/26/1919   DATE OF ADMISSION:  02/16/2007  DATE OF DISCHARGE:  02/21/2007                               DISCHARGE SUMMARY   ADDENDUM TO DISCHARGE SUMMARY:   PAST MEDICAL HISTORY:  Dr. Vernon Prey.   PRIMARY CARDIOLOGIST:  Dr. [name].   PRIMARY RHEUMATOLOGIST:  Dr. Syliva Overman.   For discharge diagnoses, procedures, consultations, discharge  medications and detailed clinical course, refer to interim discharge  summary dated February 19, 2007.  The patient on February 20, 2007,  complained of diarrhea with passage of about three loose motions.  She  was started on intravenous fluids and planned discharge, deferred at  that time.  Physiotherapy evaluated the patient.  She appears to have no  focal neurologic deficit, had no loss of balance, although was mildly  unsteady on ambulation.  She had been recommended 24-hour supervision on  discharge home, as well as home health PT/OT and safety evaluation.  Meclizine dosage was reduced to 12.5 mg p.o. b.i.d. because the patient  had appeared somewhat somnolent on February 19, 2007.  Throughout the  period of observation on November 3-4, 2008, no further somnolence was  noticed, and in a.m. of February 21, 2007, the patient was fully alert  with no focal neurologic deficits whatsoever, and is quite keen to be  discharged.  Loose motions had resolved completely, reportedly this was  secondary to ingestion of oatmeal which the patient says usually causes  her to have loose motions.   DISPOSITION:  The patient has been discharged accordingly, with follow-  up plans as elucidated in discharge summary of February 19, 2007.      Isidor Holts, M.D.  Electronically Signed     CO/MEDQ  D:  02/21/2007   T:  02/22/2007  Job:  [id number]   cc:   [name], M.D.  [name], MD, [facility]  Lemmie Evens, M.D.  Danae Orleans. Venetia Maxon, M.D.

## 2010-09-01 NOTE — H&P (Signed)
[name], [name]              ACCOUNT NO.:  [id number]   MEDICAL RECORD NO.:  [id number]          PATIENT TYPE:  IPS   LOCATION:  4003                         FACILITY:  [facility]   PHYSICIAN:  [name], M.D.DATE OF BIRTH:  01-Jun-1919   DATE OF ADMISSION:  03/21/2007  DATE OF DISCHARGE:                              HISTORY & PHYSICAL   PRIMARY PHYSICIAN:  [name], M.D.   CHIEF COMPLAINT:  Weakness and confusion.   HISTORY OF PRESENT ILLNESS:  This is an 77 year old white female with a  history of atrial fibrillation initially admitted in early November  after a fall with a small subdural hemorrhage.  She was readmitted on  the 18th with altered mental status, and followup head CT revealed  increased size of her left subdural hemorrhage with loculated collection  measuring 2.5 cm.  She underwent a left craniotomy on the same day by  Dr. Venetia Maxon.  A CT on November 25 revealed persistent midline shift, and  surgery recommended close monitoring.  The patient was placed on Cipro  for UTI.  Keppra was added for seizure prophylaxis.  The patient  continues to struggle with mobility and self care as well as cognition  and thus was admitted to the inpatient rehab unit today to improve upon  these functional deficits.  Incidentally a repeat CT was performed  today, which was unchanged from this prior CAT scan.   REVIEW OF SYSTEMS:  Notable for some dementia, reflux, palpitations.  Patient has been bowel and bladder continent per chart.   PAST MEDICAL HISTORY:  1. Positive for atrial fibrillation, not on Coumadin.  2. Hypertension.  3. Dementia.  4. Hypothyroidism.  5. Reflux disease.  6. Hyperlipidemia.  7. Decreased hearing.  8. Rheumatoid arthritis.  9. Cholecystectomy.  10.Hysterectomy.   The patient denies alcohol or tobacco use.   FAMILY HISTORY:  Positive for CAD.   SOCIAL HISTORY:  The patient lives alone.  Her daughter assists her in  the day and son  assists at night.  The patient was essentially  independent at a distant supervision level at home.  Has a one-level  house.   ALLERGIES:  None.   HOME MEDICATIONS:  1. Cardizem.  2. Methotrexate.  3. HCTZ.  4. Levothyroxine.  5. Aricept.  6. Pravachol.  7. Protonix.  8. Lidoderm patch.  9. Multivitamin.  10.K-Dur.  11.Meclizine.   LABORATORIES:  Hemoglobin 12.6, white count 6.8, platelets are 292,000.  Sodium 137, potassium 4.3, BUN and creatinine 13 and 0.8.   PHYSICAL EXAMINATION:  VITAL SIGNS:  Blood pressure is 180/60, pulse is  58, respiratory 20, temperature 97.0.  GENERAL:  The patient is generally alert, can follow simple commands.  HEENT:  Pupils equal, round, reactive to light.  Ear, nose, throat exam  is unremarkable.  Fair dentition, oral mucosa.  Head was notable for  left surgical wound which was clean, dry and intact.  She had a raised  area on the right cheek which was suspicious for a cancerous lesion.  It  appeared to have some breakdown centrally.  NECK:  Supple without  JVD or lymphadenopathy.  CHEST:  Clear to auscultation bilaterally without wheezes, rales or  rhonchi.  HEART:  Regular rate and rhythm without murmurs, rubs or gallops.  ABDOMEN:  Soft, nontender.  Bowel sounds are positive.  SKIN:  Was notable for the above.  Otherwise intact.  NEUROLOGICALLY:  Cranial nerves II-XII were grossly intact except for  hearing, which was poor.  She has problems with sequencing tasks,  initiating tasks.  Question whether there is receptive language deficit.  Judgment, orientation, memory and mood were all affected by her hearing  and cognitive deficits.  She was able to orient to her name and  partially the reason she is here but not the place or date.  The patient  was generally pleasant, although could be a bit irritable at times.  Strength was generally 5/5 with some decreased coordination still on the  right side over the left.   ASSESSMENT/PLAN:  1.  Functional deficits secondary to left subdural hemorrhage, status      post craniotomy November 18.  Begin comprehensive inpatient rehab      with PT to assess and treat range of motion, balance and equipment.      OT will assess and treat range of motion, strengthening, ADLs, and      some cognitive/perceptual training.  Speech language pathology will      follow for cognition and language deficits.  Rehab nurse will      follow on a 24-hour basis for bowel, bladder, skin and wound issues      as well as nutrition.  Case manager/social worker to assess for      psychosocial needs and discharge planning.  Estimated length of      stay is 7 days.  Goal supervision.  Prognosis fair to good.  2. Deep venous thrombosis prophylaxis with ambulation.  3. Seizure prophylaxis with Keppra.  We can probably discontinue that      soon over the next few days.  4. Hypertension.  Continue HCTZ and Cardizem.  5. Dementia:  Aricept.  6. Hypothyroidism:  Synthroid.  7. Atrial fibrillation:  The patient's heart rate is controlled at the      moment.  We will monitor serially.  8. Escherichia coli urinary tract infection:  Continue Cipro for now.  9. Rheumatoid arthritis:  The patient is on methotrexate weekly.  10.Reflux disease:  Continue Protonix.      [name], M.D.  Electronically Signed     ZTS/MEDQ  D:  03/21/2007  T:  03/21/2007  Job:  [id number]

## 2010-09-04 NOTE — Procedures (Signed)
   NAME:  [name], [name]                        ACCOUNT NO.:  [id number]   MEDICAL RECORD NO.:  [id number]                   PATIENT TYPE:  OUT   LOCATION:  RAD                                  FACILITY:  [facility]   PHYSICIAN:  Empire Bing, M.D. [facility]           DATE OF BIRTH:  April 05, 1920   DATE OF PROCEDURE:                              AGE:  77  DATE OF DISCHARGE:                              SEX:  F                                CARDIAC ULTRASOUND   REFERRING PHYSICIANS:  Dr. Christell Constant and Jesse Sans. Wall, M.D.   CLINICAL INFORMATION:  An 77 year old gentleman with atrial fibrillation.   M-MODE:  AORTA:  3.1  (<4.0)  LEFT ATRIUM:  3.4  (<4.0)  SEPTUM:  1.2  (0.7-1.1)  POSTERIOR WALL:  1.1  (0.7-1.1)  LV-DIASTOLE:  4.1  (<5.7)  LV-SYSTOLE:  2.5  (<4.0)   FINDINGS/IMPRESSION:  1. A technically adequate echocardiographic study.  2. Very mild left atrial enlargement; normal right atrial size.  Normal     right ventricular size and function.  3. Mild aortic root with dilatation with mild-to-moderate annular     calcification and mild-to-moderate leaflet sclerosis.  Trivial aortic     insufficiency and no stenosis by Doppler.  4. Normal mitral valve with mild-to-moderate annular calcification and very     mild regurgitation.  5. Normal tricuspid valves; physiologic TR; normal estimated RV systolic     pressure.  6. Normal internal dimension of the left ventricle; mild LDH.  Normal-to-     hyperdynamic regional and global LV systolic function.                                               Wilder Bing, M.D. [facility]    RR/MEDQ  D:  04/23/2002  T:  04/24/2002  Job:  [id number]

## 2010-09-04 NOTE — Procedures (Signed)
[facility]  Patient:    BULAR, HICKOK                     MRN: [id number] Proc. Date: 05/19/99 Adm. Date:  [id number] Attending:  Louie Bun CC:         Monica Becton, M.D.                           Procedure Report  PROCEDURE:  Flexible sigmoidoscopy.  INDICATIONS FOR PROCEDURE:  At screening colonoscopy the patient underwent attempted flexible sigmoidoscopy, which she did not tolerate and at 80 has had no prior colon screening.  DESCRIPTION OF PROCEDURE:  The patient was placed in the left lateral decubitus position and placed on the pulse monitor with continuous low-flow oxygen delivered by nasal cannula.  She was sedated with 70 mg IV Demerol and 7 mg IV Versed.  The Olympus video pediatric colonoscope was inserted into the rectum and advanced as far as possible, but severe angulation was encountered at about 20 cm; despite multiple torquing maneuvers I was unable to advance the scope beyond about 28 cm.  There were numerous diverticula in this area, and due to fear of possible perforation I elected to abort the procedure and proceed with flexible sigmoidoscopy with Barium enema.  The scope was then withdrawn and the patient returned to the recovery room in stable condition. She tolerated the procedure well and there were no immediate complications.  IMPRESSION: 1. Sigmoid diverticulosis. 2. Unsuccessful colonoscopy attempt.  PLAN:  Will proceed with barium enema later today. DD:  05/18/00 TD:  05/18/00 Job: [id number]

## 2010-09-04 NOTE — Assessment & Plan Note (Signed)
[facility]                            CARDIOLOGY OFFICE NOTE   NAME:[name], [place]olinar                     MRN:          [id number]  DATE:03/16/2006                            DOB:          02-03-1920    PRIMARY:  Vernon Prey.   REASON FOR PRESENTATION:  Evaluate the patient with aortic stenosis and  previous history of atrial fibrillation.   HISTORY OF PRESENT ILLNESS:  The patient presents for followup of the  above.  It has been about a year and a half since I last saw her.  She  has done very well from a cardiovascular standpoint.  She is limited by  some back pain.  However, she gets along in her house and goes up and  down stairs.  She does her activities of daily living.  With this, she  denies any shortness of breath, chest, neck, or arm discomfort.  She has  no activity-induced nausea, vomiting, or excessive diaphoresis.  She has  not noticed any palpitations.  She denies any presyncope or syncope.   PAST MEDICAL HISTORY:  1. Hypothyroidism.  2. Hyperlipidemia.  3. History of atrial fibrillation in the 1990s.  4. Mild aortic annular calcification with sclerosis but no significant      stenosis on echo in 2004.  5. Cholecystectomy.  6. Hysterectomy.   ALLERGIES:  She has been intolerant to Darvocet.   MEDICATIONS:  1. Cardizem CD 240 mg daily.  2. Levoxyl 0.008 mg daily.  3. Diazepam.  4. Ultram.  5. Vitamins.  6. Pravachol 40 mg daily.  7. Gabapentin 200 mg daily.  8. Folic acid.   REVIEW OF SYSTEMS:  As stated in the HPI and otherwise negative for  other systems.   PHYSICAL EXAMINATION:  The patient is in no distress.  Weight 131 pounds, body mass index 25.  She has a pleasant affect.  Blood pressure 156/84, heart rate 60 and regular.  HEENT:  Eyes unremarkable.  Pupils equal, round, and reactive to light.  Fundi not visualized.  Oral mucosa unremarkable.  NECK:  No jugular venous distention with a normal waveform, carotid  upstroke brisk and symmetric, right greater than left transmitted  systolic murmur versus bruit, no thyromegaly.  LYMPHATICS:  No cervical, axillary, or inguinal adenopathy.  LUNGS:  Clear to auscultation bilaterally.  BACK:  No costovertebral angle tenderness.  CHEST:  Unremarkable.  HEART:  PMI nondisplaced or sustained, S1 and S2 within normal limits,  no S3, no S4, 2/6 apical systolic murmur early peak and radiating  slightly out the aortic outflow tract heard best at the right upper  sternal border, no diastolic murmurs.  ABDOMEN:  Flat, positive bowel sounds, normal in frequency and pitch, no  bruits, no rebound, no guarding.  No midline pulsatile mass, no  hepatomegaly, no splenomegaly.  SKIN:  No rashes, no nodules.  EXTREMITIES:  2+ pulses throughout, no cyanosis, no clubbing, trace  bilateral lower extremity edema.  NEURO:  Oriented to person, place, and time, cranial nerves II through  XII grossly intact, motor grossly intact throughout.   EKG sinus rhythm, rate  60, axis within normal limits, intervals within  normal limits, early transition lead V2 probable lead placement, no  acute ST-T wave changes.   ASSESSMENT AND PLAN:  1. Hypertension.  Blood pressure is slightly elevated.  It has been      elevated at the previous appointments here and with Dr. Christell Constant just      slightly.  I am going to take the liberty of adding      hydrochlorothiazide 12.5 mg daily.  She is going to get a BMET done      in 2 weeks.  She knows to increase potassium-containing foods.  2. Aortic sclerosis.  I doubt that this is at all significant and      would not need another echocardiogram.  No further cardiovascular      testing is suggested.  3. Atrial fibrillation.  She has had none of this.  No further      cardiovascular testing is suggested.  4. Bruit.  The patient has a probable transmitted systolic murmur.      However, I could not exclude a bruit and so screening with an       ultrasound is reasonable.  5. Followup will be as needed in this clinic.     [name], MD, [facility]  Electronically Signed    JH/MedQ  DD: 03/16/2006  DT: 03/17/2006  Job #: [id number]   cc:   [name], M.D.

## 2011-01-12 LAB — BASIC METABOLIC PANEL
BUN: 10
CO2: 27
Chloride: 107
GFR calc non Af Amer: >60
Glucose, Bld: 90
Potassium: 3.7
Sodium: 141

## 2011-01-25 LAB — DIFFERENTIAL
Basophils Absolute: 0
Eosinophils Absolute: 0.2
Eosinophils Relative: 3
Lymphocytes Relative: 17
Lymphs Abs: 1.2
Neutrophils Relative %: 74

## 2011-01-25 LAB — BASIC METABOLIC PANEL
CO2: 26
CO2: 28
Calcium: 8.6
Chloride: 105
Chloride: 106
Creatinine, Ser: 0.7
GFR calc Af Amer: >60
GFR calc Af Amer: >60
Potassium: 3.6
Sodium: 140
Sodium: 140

## 2011-01-25 LAB — CBC
HCT: 33.5 — ABNORMAL LOW
HCT: 33.5 — ABNORMAL LOW
Hemoglobin: 11.3 — ABNORMAL LOW
Hemoglobin: 11.4 — ABNORMAL LOW
MCHC: 33.9
MCV: 101.6 — ABNORMAL HIGH
MCV: 103.1 — ABNORMAL HIGH
RBC: 3.25 — ABNORMAL LOW
RBC: 3.3 — ABNORMAL LOW
WBC: 7.4
WBC: 7.8

## 2011-01-25 LAB — COMPREHENSIVE METABOLIC PANEL
AST: 33
CO2: 29
Calcium: 9
Chloride: 103
Creatinine, Ser: 0.72
GFR calc non Af Amer: >60
Glucose, Bld: 104 — ABNORMAL HIGH
Total Bilirubin: 0.8

## 2011-01-25 LAB — URINE MICROSCOPIC-ADD ON

## 2011-01-25 LAB — URINALYSIS, ROUTINE W REFLEX MICROSCOPIC
Bilirubin Urine: NEGATIVE
Nitrite: NEGATIVE
Protein, ur: NEGATIVE
Specific Gravity, Urine: 1.021
Specific Gravity, Urine: 1.03
Urobilinogen, UA: 0.2
Urobilinogen, UA: 0.2

## 2011-01-25 LAB — URINE CULTURE

## 2011-01-26 LAB — URINALYSIS, ROUTINE W REFLEX MICROSCOPIC
Protein, ur: NEGATIVE
Urobilinogen, UA: 0.2

## 2011-01-26 LAB — CBC
Hemoglobin: 12.6
MCV: 104 — ABNORMAL HIGH
Platelets: 292
Platelets: 263
RDW: 13.1
RDW: 13.2
WBC: 7.8

## 2011-01-26 LAB — BASIC METABOLIC PANEL
BUN: 11
BUN: 14
BUN: 16
Calcium: 8.3 — ABNORMAL LOW
Calcium: 9.4
Calcium: 8.9
Calcium: 9
Chloride: 104
Chloride: 105
Chloride: 109
Creatinine, Ser: 0.81
Creatinine, Ser: 0.77
Creatinine, Ser: 0.81
Creatinine, Ser: 0.83
Creatinine, Ser: 0.85
GFR calc Af Amer: >60
GFR calc Af Amer: >60
GFR calc Af Amer: >60
GFR calc non Af Amer: >60
GFR calc non Af Amer: >60
GFR calc non Af Amer: >60
GFR calc non Af Amer: >60
GFR calc non Af Amer: >60
Glucose, Bld: 87
Glucose, Bld: 88
Sodium: 138

## 2011-01-26 LAB — MAGNESIUM
Magnesium: 2.1
Magnesium: 2.1
Magnesium: 2.4

## 2011-01-26 LAB — URINE CULTURE: Colony Count: >=100000

## 2011-01-26 LAB — URINE MICROSCOPIC-ADD ON

## 2011-01-26 LAB — SODIUM: Sodium: 137

## 2011-01-27 LAB — CARDIAC PANEL(CRET KIN+CKTOT+MB+TROPI)
CK, MB: 3.8
Relative Index: 1.5
Relative Index: 1.9
Total CK: 256 — ABNORMAL HIGH
Troponin I: 0.02

## 2011-01-27 LAB — DIFFERENTIAL
Basophils Absolute: 0
Lymphocytes Relative: 26
Lymphs Abs: 1.2
Neutro Abs: 2.7
Neutrophils Relative %: 60

## 2011-01-27 LAB — CK TOTAL AND CKMB (NOT AT ARMC)
Relative Index: 1.8
Total CK: 203 — ABNORMAL HIGH

## 2011-01-27 LAB — POTASSIUM: Potassium: 3.8

## 2011-01-27 LAB — CBC
HCT: 37.1
Hemoglobin: 12.4
MCHC: 33.5
MCV: 103.2 — ABNORMAL HIGH
Platelets: 250
RBC: 3.59 — ABNORMAL LOW
RDW: 13.4
RDW: 13.6
WBC: 4.6

## 2011-01-27 LAB — LIPID PANEL
LDL Cholesterol: 82
Total CHOL/HDL Ratio: 2.5
VLDL: 10

## 2011-01-27 LAB — POCT CARDIAC MARKERS
CKMB, poc: 2.7
Troponin i, poc: <0.05

## 2011-01-27 LAB — I-STAT 8, (EC8 V) (CONVERTED LAB)
Acid-Base Excess: 5 — ABNORMAL HIGH
Bicarbonate: 31.3 — ABNORMAL HIGH
Hemoglobin: 13.3
Potassium: 3.6
Sodium: 144
TCO2: 33

## 2011-01-27 LAB — VITAMIN B12: Vitamin B-12: 587 (ref 211–911)

## 2011-01-27 LAB — URINE MICROSCOPIC-ADD ON

## 2011-01-27 LAB — URINALYSIS, ROUTINE W REFLEX MICROSCOPIC
Bilirubin Urine: NEGATIVE
Glucose, UA: NEGATIVE
Hgb urine dipstick: NEGATIVE
Specific Gravity, Urine: 1.019
Urobilinogen, UA: 0.2

## 2011-01-27 LAB — BASIC METABOLIC PANEL
CO2: 30
Chloride: 101
Creatinine, Ser: 0.69
GFR calc Af Amer: >60
Potassium: 2.9 — ABNORMAL LOW
Sodium: 140

## 2011-01-27 LAB — POCT I-STAT CREATININE
Creatinine, Ser: 0.8
Operator id: 198171

## 2011-01-27 LAB — URINE CULTURE: Colony Count: NO GROWTH

## 2011-01-27 LAB — PROTIME-INR: INR: 0.9

## 2011-01-27 LAB — MAGNESIUM: Magnesium: 2.1

## 2011-01-27 LAB — FOLATE: Folate: >20

## 2011-01-27 LAB — APTT: aPTT: 26

## 2011-01-27 LAB — TROPONIN I: Troponin I: 0.01

## 2013-03-20 ENCOUNTER — Emergency Department (HOSPITAL_COMMUNITY): Payer: PRIVATE HEALTH INSURANCE

## 2013-03-20 ENCOUNTER — Inpatient Hospital Stay (HOSPITAL_COMMUNITY)
Admission: EM | Admit: 2013-03-20 | Discharge: 2013-03-26 | DRG: 689 | Disposition: A | Discharge status: Hospice | Payer: PRIVATE HEALTH INSURANCE | Attending: Internal Medicine | Admitting: Internal Medicine

## 2013-03-20 DIAGNOSIS — Z515 Encounter for palliative care: Secondary | ICD-10-CM

## 2013-03-20 DIAGNOSIS — N390 Urinary tract infection, site not specified: Principal | ICD-10-CM | POA: Diagnosis present

## 2013-03-20 DIAGNOSIS — Z66 Do not resuscitate: Secondary | ICD-10-CM | POA: Diagnosis present

## 2013-03-20 DIAGNOSIS — R4182 Altered mental status, unspecified: Secondary | ICD-10-CM | POA: Diagnosis present

## 2013-03-20 DIAGNOSIS — F0280 Dementia in other diseases classified elsewhere without behavioral disturbance: Secondary | ICD-10-CM | POA: Diagnosis present

## 2013-03-20 DIAGNOSIS — Z993 Dependence on wheelchair: Secondary | ICD-10-CM

## 2013-03-20 DIAGNOSIS — G9340 Encephalopathy, unspecified: Secondary | ICD-10-CM | POA: Diagnosis present

## 2013-03-20 DIAGNOSIS — E860 Dehydration: Secondary | ICD-10-CM | POA: Diagnosis present

## 2013-03-20 DIAGNOSIS — G309 Alzheimer's disease, unspecified: Secondary | ICD-10-CM | POA: Diagnosis present

## 2013-03-20 DIAGNOSIS — E870 Hyperosmolality and hypernatremia: Secondary | ICD-10-CM | POA: Diagnosis present

## 2013-03-20 DIAGNOSIS — I959 Hypotension, unspecified: Secondary | ICD-10-CM | POA: Diagnosis present

## 2013-03-20 HISTORY — DX: Unspecified dementia, unspecified severity, without behavioral disturbance, psychotic disturbance, mood disturbance, and anxiety: F03.90

## 2013-03-20 LAB — CBC WITH DIFFERENTIAL/PLATELET
Basophils Absolute: 0 10*3/uL (ref 0.0–0.1)
Basophils Relative: 0 % (ref 0–1)
Eosinophils Absolute: 0.3 10*3/uL (ref 0.0–0.7)
Hemoglobin: 13.5 g/dL (ref 12.0–15.0)
MCH: 32.9 pg (ref 26.0–34.0)
MCHC: 32.2 g/dL (ref 30.0–36.0)
Monocytes Relative: 6 % (ref 3–12)
Neutrophils Relative %: 50 % (ref 43–77)
RDW: 12.5 % (ref 11.5–15.5)

## 2013-03-20 MED ORDER — SODIUM CHLORIDE 0.9 % IV SOLN: INTRAVENOUS | Status: DC

## 2013-03-20 MED ORDER — SODIUM CHLORIDE 0.9 % IV BOLUS (SEPSIS)
700.0000 mL | Freq: Once | INTRAVENOUS | Status: AC
  Administered 2013-03-21: 700 mL via INTRAVENOUS

## 2013-03-20 NOTE — ED Provider Notes (Signed)
CSN: [id number]     Arrival date & time 03/20/13  2222 History   This chart was scribed for Ward Givens, MD by Bennett Scrape, ED Scribe. This patient was seen in room APA05/APA05 and the patient's care was started at 11:22 PM.    Chief Complaint  Patient presents with  . Altered Mental Status   Level 5 Caveat-Dementia  The history is provided by the nursing home and a relative. No language interpreter was used.    HPI Comments: [name] is a 79 y.o. female brought in by ambulance from [facility], who presents to the Emergency Department for AMS noted today. ED nurse spoke with daughter who stated that pt was not chewing as much as normal and had decreased use of left side. This was noticed by her speech therapist today at 3:30 pm. The family had last seen her the day before and she was able to speak clearly, although she might say the wrong words. She also appeared more lethargic. Since being in the ED, the pt has been using the left side, no weakness noted. Daughter also reported that the pt has slurred speech that she receives therapy for. She noted today that the speech seemed more slurred than normal. At baseline, the pt is demented with intermittent incoherent speech. Daughter states that this is due to a fall with brain injury and subsequent surgery. The pt is a full assist with ADL. She is unable to do any ADLs on her own. Today was first day that the daughter has seen the pt in one week. Other family members noted that the pt has been at her normal baseline over the past week. She was last seen normal around 3:30 PM this afternoon.   Pt is DO NOT RESUSCITATE She has been at [facility] for the past 3 years  PCP Dr Virgina Organ  Past Medical History  Diagnosis Date  . Dementia    Past Surgical History  Procedure Laterality Date  . Brain surgery     No family history on file. History  Substance Use Topics  . Smoking status: Not on file  . Smokeless tobacco: Not on file   . Alcohol Use: Not on file  in NH for 3 years Does not smoke Does not drink alcohol   OB History   Grav Para Term Preterm Abortions TAB SAB Ect Mult Living                 Review of Systems  Unable to perform ROS: Mental status change    Allergies  Review of patient's allergies indicates no known allergies.  Home Medications   Current Outpatient Rx  Name  Route  Sig  Dispense  Refill  . diazepam (VALIUM) 5 MG tablet   Oral   Take 2.5 mg by mouth daily. For spastic tremors of hands         . diphenhydrAMINE (SOMINEX) 25 MG tablet   Oral   Take 12.5 mg by mouth every 6 (six) hours as needed for itching or sleep.         . folic acid (FOLVITE) 400 MCG tablet   Oral   Take 400 mcg by mouth daily.         Marland Kitchen levothyroxine (SYNTHROID, LEVOTHROID) 150 MCG tablet   Oral   Take 150 mcg by mouth daily before breakfast.         . vitamin B-12 (CYANOCOBALAMIN) 1000 MCG tablet   Oral   Take  1,000 mcg by mouth daily.         . Vitamin D, Ergocalciferol, (DRISDOL) 50000 UNITS CAPS capsule   Oral   Take 50,000 Units by mouth every 30 (thirty) days. Administered on the 16th of each month          Triage Vitals: BP 153/80  Pulse 66  SpO2 98%/rectal temp 97.5  Vital signs normal    Physical Exam  Nursing note and vitals reviewed. Constitutional: She appears well-developed and well-nourished.  Non-toxic appearance. She does not appear ill. No distress.  Pt sleeping, when aroused reaches for my white coat, speech is incomprehensible.  HENT:  Head: Normocephalic and atraumatic.  Right Ear: External ear normal.  Left Ear: External ear normal.  Nose: Nose normal. No mucosal edema or rhinorrhea.  Mouth/Throat: Oropharynx is clear and moist. Mucous membranes are dry. No dental abscesses or uvula swelling.  Edentulous. Lips and tongue are dry  Eyes: Conjunctivae and EOM are normal. Pupils are equal, round, and reactive to light.  Neck: Normal range of motion and  full passive range of motion without pain. Neck supple.  Cardiovascular: Normal rate and regular rhythm.  Exam reveals no gallop and no friction rub.   Murmur (soft) heard. Pulmonary/Chest: Effort normal and breath sounds normal. No respiratory distress. She has no wheezes. She has no rhonchi. She has no rales. She exhibits no tenderness and no crepitus.  Abdominal: Soft. Normal appearance and bowel sounds are normal. She exhibits no distension. There is no tenderness. There is no rebound and no guarding.  Musculoskeletal: Normal range of motion. She exhibits no edema and no tenderness.  Moves all extremities well.   Neurological: She is alert. She has normal strength. No cranial nerve deficit.  Purposeful movement of arms with slightly less strength on the left grip. Does not follow commands. Speech is incomprehensible.  Skin: Skin is warm, dry and intact. No rash noted. No erythema. No pallor.  Psychiatric: She has a normal mood and affect. Her speech is normal and behavior is normal. Her mood appears not anxious.    ED Course  Procedures (including critical care time)  Medications  0.9 %  sodium chloride infusion (not administered)  sodium chloride 0.9 % bolus 700 mL (0 mLs Intravenous Stopped 03/21/13 0139)  sodium chloride 0.9 % bolus 1,000 mL (1,000 mLs Intravenous New Bag/Given 03/21/13 0142)  cefTRIAXone (ROCEPHIN) 1 g in dextrose 5 % 50 mL IVPB (0 g Intravenous Stopped 03/21/13 0208)    DIAGNOSTIC STUDIES: Oxygen Saturation is 98% on room air, normal by my interpretation.    COORDINATION OF CARE: 11:27 PM-Discussed treatment plan which includes medications, CXR, CT of head, CBC panel, CMP, and UA with pt's family at bedside and they agreed to plan.   Patient given IV fluids for her apparent dehydration seen on exam. After review of her laboratory results she was given IV Rocephin for possible UTI. Her sodium is slightly higher than her baseline although she has been up to 145 in  the past. I've talked to her daughter that her mental status change may be from her urinary tract infection and dehydration from decreased oral intake. However patient could have a subtle new stroke that's not seen on CT scan. Daughter wants her to be admitted.  01:22 Dr Orvan Falconer, admit to med-surg.  Labs Review Results for orders placed during the hospital encounter of 03/20/13  CBC WITH DIFFERENTIAL      Result Value Range   WBC 6.4  4.0 -  10.5 K/uL   RBC 4.10  3.87 - 5.11 MIL/uL   Hemoglobin 13.5  12.0 - 15.0 g/dL   HCT 16.1  09.6 - 04.5 %   MCV 102.2 (*) 78.0 - 100.0 fL   MCH 32.9  26.0 - 34.0 pg   MCHC 32.2  30.0 - 36.0 g/dL   RDW 40.9  81.1 - 91.4 %   Platelets 193  150 - 400 K/uL   Neutrophils Relative % 50  43 - 77 %   Neutro Abs 3.2  1.7 - 7.7 K/uL   Lymphocytes Relative 39  12 - 46 %   Lymphs Abs 2.5  0.7 - 4.0 K/uL   Monocytes Relative 6  3 - 12 %   Monocytes Absolute 0.4  0.1 - 1.0 K/uL   Eosinophils Relative 5  0 - 5 %   Eosinophils Absolute 0.3  0.0 - 0.7 K/uL   Basophils Relative 0  0 - 1 %   Basophils Absolute 0.0  0.0 - 0.1 K/uL  COMPREHENSIVE METABOLIC PANEL      Result Value Range   Sodium 148 (*) 135 - 145 mEq/L   Potassium 3.5  3.5 - 5.1 mEq/L   Chloride 111  96 - 112 mEq/L   CO2 30  19 - 32 mEq/L   Glucose, Bld 106 (*) 70 - 99 mg/dL   BUN 22  6 - 23 mg/dL   Creatinine, Ser 7.82  0.50 - 1.10 mg/dL   Calcium 9.0  8.4 - 95.6 mg/dL   Total Protein 7.2  6.0 - 8.3 g/dL   Albumin 3.5  3.5 - 5.2 g/dL   AST 22  0 - 37 U/L   ALT 23  0 - 35 U/L   Alkaline Phosphatase 87  39 - 117 U/L   Total Bilirubin 0.5  0.3 - 1.2 mg/dL   GFR calc non Af Amer 59 (*) >90 mL/min   GFR calc Af Amer 68 (*) >90 mL/min  URINALYSIS, ROUTINE W REFLEX MICROSCOPIC      Result Value Range   Color, Urine YELLOW  YELLOW   APPearance HAZY (*) CLEAR   Specific Gravity, Urine 1.020  1.005 - 1.030   pH 6.5  5.0 - 8.0   Glucose, UA NEGATIVE  NEGATIVE mg/dL   Hgb urine dipstick NEGATIVE   NEGATIVE   Bilirubin Urine NEGATIVE  NEGATIVE   Ketones, ur NEGATIVE  NEGATIVE mg/dL   Protein, ur TRACE (*) NEGATIVE mg/dL   Urobilinogen, UA 0.2  0.0 - 1.0 mg/dL   Nitrite POSITIVE (*) NEGATIVE   Leukocytes, UA TRACE (*) NEGATIVE  TROPONIN I      Result Value Range   Troponin I <0.30  <0.30 ng/mL  URINE MICROSCOPIC-ADD ON      Result Value Range   Squamous Epithelial / LPF MANY (*) RARE   WBC, UA 11-20  <3 WBC/hpf   RBC / HPF 3-6  <3 RBC/hpf   Bacteria, UA MANY (*) RARE   Laboratory interpretation all normal except possible UTI (contaminated specimen) , hypernatremia   Imaging Review Ct Head Wo Contrast  03/21/2013   CLINICAL DATA:  Altered mental status  EXAM: CT HEAD WITHOUT CONTRAST  TECHNIQUE: Contiguous axial images were obtained from the base of the skull through the vertex without intravenous contrast.  COMPARISON:  Prior CT from 03/01/2008  FINDINGS: Extensive age-related atrophy with chronic microvascular ischemic disease is seen, advanced relative to prior examination. Ventricles are prominent in size, but not out of  proportion to the cortical sulci. No acute intracranial hemorrhage or infarct. No mass or midline shift. No extra-axial fluid collection. Gray-white matter differentiation is well maintained.  Sequelae of prior left frontal craniotomy is seen within the left frontal calvarium. Orbits are within normal limits. Paranasal sinuses and mastoid air cells are clear.  IMPRESSION: 1. No acute intracranial process. 2. Extensive atrophy and chronic microvascular ischemic disease, advanced relative to prior CT from 05/02/2007.   Electronically Signed   By: Rise Mu M.D.   On: 03/21/2013 00:02   Dg Chest Portable 1 View  03/21/2013   CLINICAL DATA:  Altered mental status  EXAM: PORTABLE CHEST - 1 VIEW  COMPARISON:  07/25/2008  FINDINGS: Hypoaeration. Aortic tortuosity and prominence. Elevated hemidiaphragms partially obscure the heart and lung bases. Allowing for  this, the heart is mildly prominent. Interstitial prominence. Mild linear left lung base opacity. Linear calcific density projecting over the left hemidiaphragm is unchanged, nonspecific. Osteopenia. No appreciable acute osseous finding.  IMPRESSION: Hypoaeration with mild left lung base opacity, favor atelectasis or scarring.  Aortic prominence/tortuosity.   Electronically Signed   By: Jearld Lesch M.D.   On: 03/21/2013 00:06    EKG Interpretation    Date/Time:  Wednesday March 21 2013 00:07:05 EST Ventricular Rate:  64 PR Interval:  196 QRS Duration: 72 QT Interval:  420 QTC Calculation: 433 R Axis:   20 Text Interpretation:  Normal sinus rhythm Normal ECG When compared with ECG of 19-Jul-2008 06:20, nonspecific T wave abnormality is less evident Confirmed by Baleigh Rennaker  MD-I, Fleur Audino ([id number]) on 03/21/2013 12:48:37 AM            MDM   1. Altered mental status   2. Hypernatremia   3. Dehydration   4. UTI (urinary tract infection)    Plan admission  Devoria Albe, MD, FACEP   I personally performed the services described in this documentation, which was scribed in my presence. The recorded information has been reviewed and considered.  Devoria Albe, MD, Armando Gang    Ward Givens, MD 03/21/13 [phone]

## 2013-03-20 NOTE — ED Notes (Signed)
Per EMS: pt presents from [facility] with possible AMS. EMS reports the speech therapist noted increased drooling at 3:30 pm this afternoon. Nursing staff noted that the pt was less combative this evening at roughly 9:45 pm and pt was sent here for AMS. Pt is DNR.

## 2013-03-21 ENCOUNTER — Encounter (HOSPITAL_COMMUNITY): Payer: Self-pay | Admitting: Emergency Medicine

## 2013-03-21 DIAGNOSIS — E870 Hyperosmolality and hypernatremia: Secondary | ICD-10-CM

## 2013-03-21 DIAGNOSIS — N390 Urinary tract infection, site not specified: Principal | ICD-10-CM

## 2013-03-21 DIAGNOSIS — R4182 Altered mental status, unspecified: Secondary | ICD-10-CM

## 2013-03-21 DIAGNOSIS — E860 Dehydration: Secondary | ICD-10-CM

## 2013-03-21 LAB — TSH: TSH: 0.597 u[IU]/mL (ref 0.350–4.500)

## 2013-03-21 LAB — URINE MICROSCOPIC-ADD ON

## 2013-03-21 LAB — CBC
HCT: 39.7 % (ref 36.0–46.0)
MCH: 32.5 pg (ref 26.0–34.0)
MCHC: 31.7 g/dL (ref 30.0–36.0)
MCV: 102.3 fL — ABNORMAL HIGH (ref 78.0–100.0)
Platelets: 177 10*3/uL (ref 150–400)
RDW: 12.5 % (ref 11.5–15.5)

## 2013-03-21 LAB — BASIC METABOLIC PANEL
BUN: 17 mg/dL (ref 6–23)
Creatinine, Ser: 0.75 mg/dL (ref 0.50–1.10)
GFR calc Af Amer: 83 mL/min — ABNORMAL LOW (ref >90)
GFR calc non Af Amer: 72 mL/min — ABNORMAL LOW (ref >90)
Potassium: 3.4 mEq/L — ABNORMAL LOW (ref 3.5–5.1)

## 2013-03-21 LAB — COMPREHENSIVE METABOLIC PANEL
AST: 22 U/L (ref 0–37)
Albumin: 3.5 g/dL (ref 3.5–5.2)
BUN: 22 mg/dL (ref 6–23)
Creatinine, Ser: 0.84 mg/dL (ref 0.50–1.10)
Potassium: 3.5 mEq/L (ref 3.5–5.1)
Total Protein: 7.2 g/dL (ref 6.0–8.3)

## 2013-03-21 LAB — URINALYSIS, ROUTINE W REFLEX MICROSCOPIC
Glucose, UA: NEGATIVE mg/dL
pH: 6.5 (ref 5.0–8.0)

## 2013-03-21 LAB — TROPONIN I: Troponin I: <0.3 ng/mL (ref <0.30)

## 2013-03-21 LAB — MRSA PCR SCREENING: MRSA by PCR: NEGATIVE

## 2013-03-21 MED ORDER — ONDANSETRON HCL 4 MG PO TABS: 4.0000 mg | ORAL_TABLET | Freq: Four times a day (QID) | ORAL | Status: DC | PRN

## 2013-03-21 MED ORDER — POTASSIUM CL IN DEXTROSE 5% 20 MEQ/L IV SOLN
20.0000 meq | INTRAVENOUS | Status: DC
  Administered 2013-03-21 (×2): 20 meq via INTRAVENOUS

## 2013-03-21 MED ORDER — ONDANSETRON HCL 4 MG/2ML IJ SOLN: 4.0000 mg | Freq: Four times a day (QID) | INTRAMUSCULAR | Status: DC | PRN

## 2013-03-21 MED ORDER — LEVOTHYROXINE SODIUM 100 MCG IV SOLR
75.0000 ug | Freq: Every day | INTRAVENOUS | Status: DC
  Administered 2013-03-21 – 2013-03-24 (×4): 75 ug via INTRAVENOUS
  Filled 2013-03-21 (×6): qty 5

## 2013-03-21 MED ORDER — ACETAMINOPHEN 650 MG RE SUPP: 650.0000 mg | Freq: Four times a day (QID) | RECTAL | Status: DC | PRN

## 2013-03-21 MED ORDER — ACETAMINOPHEN 325 MG PO TABS: 650.0000 mg | ORAL_TABLET | Freq: Four times a day (QID) | ORAL | Status: DC | PRN

## 2013-03-21 MED ORDER — DEXTROSE 5 % IV SOLN
1.0000 g | Freq: Once | INTRAVENOUS | Status: AC
  Administered 2013-03-21: 1 g via INTRAVENOUS
  Filled 2013-03-21: qty 10

## 2013-03-21 MED ORDER — SODIUM CHLORIDE 0.9 % IV BOLUS (SEPSIS)
1000.0000 mL | Freq: Once | INTRAVENOUS | Status: AC
  Administered 2013-03-21: 1000 mL via INTRAVENOUS

## 2013-03-21 MED ORDER — DEXTROSE 5 % IV SOLN
1.0000 g | INTRAVENOUS | Status: DC
  Administered 2013-03-22 – 2013-03-24 (×3): 1 g via INTRAVENOUS
  Filled 2013-03-21 (×5): qty 10

## 2013-03-21 MED ORDER — SODIUM CHLORIDE 0.9 % IV SOLN
INTRAVENOUS | Status: DC
  Administered 2013-03-21: 03:00:00 via INTRAVENOUS

## 2013-03-21 MED ORDER — FLEET ENEMA 7-19 GM/118ML RE ENEM: 1.0000 | ENEMA | Freq: Once | RECTAL | Status: AC | PRN

## 2013-03-21 NOTE — Progress Notes (Signed)
UR chart review completed.  

## 2013-03-21 NOTE — Progress Notes (Signed)
PT Screen Note  Patient Details Name: [name] MRN: [id number] DOB: 10/24/1919   Cancelled Treatment:    Reason Eval/Treat Not Completed: PT screened, no needs identified, will sign off.  Pt has been a resident of [facility] since 07/2008.  After a conversation with family (daughters: Liborio Nixon and Bonita Quin), she had therapy a few months ago and was then d/c due to lack of progress and continued with nursing for general mobility.  She requires a lift for transfer, and her mobility is by W/C or recliner and does not walk.  She is unable to follow commands.  Family has been educated in the past and is able to discuss importance of mobility and sitting up to decrease secondary impairments.    Ragan Duhon 03/21/2013, 11:18 AM

## 2013-03-21 NOTE — Progress Notes (Signed)
INITIAL NUTRITION ASSESSMENT  DOCUMENTATION CODES Per approved criteria  -Not Applicable   INTERVENTION: Follow for ST recommendations.  NUTRITION DIAGNOSIS: Inadequate oral intake related to inability to eat as evidenced by NPO status, dehydration on admission.   Goal: Meet nutrition needs as able  Monitor:  ST evaluation, diet advancement, po intake, skin assessments  Reason for Assessment: Low Braden = 62  79 y.o. female  Patient Active Problem List   Diagnosis Date Noted  . Altered mental status 03/21/2013  . Hypernatremia 03/21/2013  . Dehydration 03/21/2013  . UTI (urinary tract infection) 03/21/2013    ASSESSMENT: Pt is 79 yo FM with advanced dementia. DNR status. NPO pending ST evaluation. Pt unable to provide hx but daughter is present. She is from [facility]. Usually eats in restorative dining and is assisted with meals. Soft diet with ground or pureed meats thin liquids. Good appetite reported prior to acute illness. [employer] with lunch and dinner and Ensure q morning. Wt has been stable several months at 138# per facility scales. She is at risk for skin breakdown due to low Braden score.   Height: Ht Readings from Last 1 Encounters:  03/21/13 5\' 4"  (1.626 m)    Weight: Wt Readings from Last 1 Encounters:  03/21/13 130 lb 8.2 oz (59.2 kg)    Ideal Body Weight: 120# (54.5 kg)  % Ideal Body Weight: 108%  Wt Readings from Last 10 Encounters:  03/21/13 130 lb 8.2 oz (59.2 kg)    Usual Body Weight: 138#  BMI:  Body mass index is 22.39 kg/(m^2).normal range  Estimated Nutritional Needs: Kcal: 1400-1600 Protein: 59-65 gr Fluid: >1500 ml daily  Skin: intact  Diet Order: NPO  EDUCATION NEEDS: -No education needs identified at this time   Intake/Output Summary (Last 24 hours) at 03/21/13 1007 Last data filed at 03/21/13 0745  Gross per 24 hour  Intake 188.33 ml  Output      1 ml  Net 187.33 ml    Last BM:  PTA  Labs:   Recent Labs Lab 03/20/13 2337 03/21/13 0629  NA 148* 149*  K 3.5 3.4*  CL 111 114*  CO2 30 29  BUN 22 17  CREATININE 0.84 0.75  CALCIUM 9.0 8.4  GLUCOSE 106* 87    CBG (last 3)  No results found for this basename: GLUCAP,  in the last 72 hours  Scheduled Meds: . [START ON 03/22/2013] cefTRIAXone (ROCEPHIN)  IV  1 g Intravenous Q24H  . levothyroxine  75 mcg Intravenous Daily    Continuous Infusions: . dextrose 5 % with KCl 20 mEq / L      Past Medical History  Diagnosis Date  . Dementia     Past Surgical History  Procedure Laterality Date  . Brain surgery      Royann Shivers MS,RD,CSG,LDN Office: #[phone] Pager: [phone]

## 2013-03-21 NOTE — Clinical Social Work Psychosocial (Signed)
    Clinical Social Work Department BRIEF PSYCHOSOCIAL ASSESSMENT 03/21/2013  Patient:  [name], [name]     Account Number:  [id number]     Admit date:  03/20/2013  Clinical Social Worker:  Santa Genera, CLINICAL SOCIAL WORKER  Date/Time:  03/21/2013 10:30 AM  Referred by:  CSW  Date Referred:  03/21/2013 Referred for  SNF Placement   Other Referral:   Interview type:  Other - See comment Other interview type:   Left VM for daughter, spoke w [facility] admissions, Tresa Endo    PSYCHOSOCIAL DATA Living Status:  FACILITY Admitted from facility:  [facility] Level of care:  Skilled Nursing Facility Primary support name:  Hermelinda Dellen Primary support relationship to patient:  CHILD, ADULT Degree of support available:   supportive daughter, facility willing to take patient at discharge.    CURRENT CONCERNS Current Concerns  Post-Acute Placement   Other Concerns:    SOCIAL WORK ASSESSMENT / PLAN CSW attempted to assess patient, patient unable to speak and has advanced dementia.  Unable to reach daughter, left VM requesting call back.  Spoke w Chanda Busing, [facility] admissions.  Patient has been resident of SNF since 07/25/08. Requires total care, 2 person assist for all transfers, is a fall risk, facility gets patient up in wheelchair. Requires assist w feeding.  Incontinent of bowel and bladder.  Facility is not [place] patient - is being cared for under her [employer].  [facility] is willing to take patient back at discharge.   Assessment/plan status:  Psychosocial Support/Ongoing Assessment of Needs Other assessment/ plan:   Information/referral to community resources:   None needed at this time, anticipate return to [facility] at discharge.    PATIENT'S/FAMILY'S RESPONSE TO PLAN OF CARE: Unable to assess patient or family at this time.     Santa Genera, LCSW Clinical Social Worker [phone])

## 2013-03-21 NOTE — H&P (Signed)
[facility] History and Physical  [name]  YQM:[id number]  DOB: 1919-08-12   DOA: 03/21/2013   PCP:   Colon Branch, MD   Chief Complaint:  Garbled speech since today  HPI: [name] is a 79 y.o. female.  Elderly Caucasian lady with advanced dementia recently had brain surgery for what sounds like a subdural hematoma, and who is working with speech therapy, was noted today be having  garbled speech. Family have confirmed a distinct and decreased use of her left side she was brought to the emergency room for evaluation.  CODE STATUS is DO NOT RESUSCITATE   Rewiew of Systems:  Unable to obtain due to patient's advanced dementia   No past medical history on file.  No past surgical history on file.  Medications:  HOME MEDS: Prior to Admission medications   Medication Sig Start Date End Date Taking? Authorizing Provider  diazepam (VALIUM) 5 MG tablet Take 2.5 mg by mouth daily. For spastic tremors of hands   Yes Historical Provider, MD  diphenhydrAMINE (SOMINEX) 25 MG tablet Take 12.5 mg by mouth every 6 (six) hours as needed for itching or sleep.   Yes Historical Provider, MD  folic acid (FOLVITE) 400 MCG tablet Take 400 mcg by mouth daily.   Yes Historical Provider, MD  levothyroxine (SYNTHROID, LEVOTHROID) 150 MCG tablet Take 150 mcg by mouth daily before breakfast.   Yes Historical Provider, MD  vitamin B-12 (CYANOCOBALAMIN) 1000 MCG tablet Take 1,000 mcg by mouth daily.   Yes Historical Provider, MD  Vitamin D, Ergocalciferol, (DRISDOL) 50000 UNITS CAPS capsule Take 50,000 Units by mouth every 30 (thirty) days. Administered on the 16th of each month   Yes Historical Provider, MD     Allergies:  No Known Allergies  Social History:   has no tobacco, alcohol, and drug history on file.  Family History: No family history on file.   Physical Exam: Filed Vitals:   03/20/13 2231  BP: 153/80  Pulse: 66  SpO2: 98%   Blood pressure 153/80, pulse 66,  SpO2 98.00%. There is no height or weight on file to calculate BMI.   GEN:  Alert but completely nonresponsive elderly Caucasian lying bed in no acute distress; PSYCH:  alert and oriented x4;  anxious nor depressed; affect is appropriate. HEENT: Mucous membranes pink, dry and anicteric; Breasts:: Not examined CHEST WALL: No tenderness CHEST: Normal respiration, clear to auscultation bilaterally HEART: Regular rate and rhythm; 3/6 early systolic murmur ABDOMEN: soft non-tender; no masses, no organomegaly, normal abdominal bowel sounds; no pannus; no intertriginous candida. Rectal Exam: Not done EXTREMITIES:  age-appropriate arthropathy of the hands and knees; no edema; no ulcerations. Genitalia: not examined PULSES: 2+ and symmetric SKIN: Normal hydration no rash or ulceration CNS: No obvious facial weakness; generalized increase in muscle tone, difficult to tell if it is due to resistance to movement on the right. Tone is markedly increased on the left and passive movement on the left side is very   Labs on Admission:  Basic Metabolic Panel:  Recent Labs Lab 03/20/13 2337  NA 148*  K 3.5  CL 111  CO2 30  GLUCOSE 106*  BUN 22  CREATININE 0.84  CALCIUM 9.0   Liver Function Tests:  Recent Labs Lab 03/20/13 2337  AST 22  ALT 23  ALKPHOS 87  BILITOT 0.5  PROT 7.2  ALBUMIN 3.5   No results found for this basename: LIPASE, AMYLASE,  in the last 168 hours No results found for  this basename: AMMONIA,  in the last 168 hours CBC:  Recent Labs Lab 03/20/13 2337  WBC 6.4  NEUTROABS 3.2  HGB 13.5  HCT 41.9  MCV 102.2*  PLT 193   Cardiac Enzymes:  Recent Labs Lab 03/20/13 2337  TROPONINI <0.30   BNP: No components found with this basename: POCBNP,  D-dimer: No components found with this basename: D-DIMER,  CBG: No results found for this basename: GLUCAP,  in the last 168 hours  Radiological Exams on Admission: Ct Head Wo Contrast  03/21/2013   CLINICAL  DATA:  Altered mental status  EXAM: CT HEAD WITHOUT CONTRAST  TECHNIQUE: Contiguous axial images were obtained from the base of the skull through the vertex without intravenous contrast.  COMPARISON:  Prior CT from 03/01/2008  FINDINGS: Extensive age-related atrophy with chronic microvascular ischemic disease is seen, advanced relative to prior examination. Ventricles are prominent in size, but not out of proportion to the cortical sulci. No acute intracranial hemorrhage or infarct. No mass or midline shift. No extra-axial fluid collection. Gray-white matter differentiation is well maintained.  Sequelae of prior left frontal craniotomy is seen within the left frontal calvarium. Orbits are within normal limits. Paranasal sinuses and mastoid air cells are clear.  IMPRESSION: 1. No acute intracranial process. 2. Extensive atrophy and chronic microvascular ischemic disease, advanced relative to prior CT from 05/02/2007.   Electronically Signed   By: Rise Mu M.D.   On: 03/21/2013 00:02   Dg Chest Portable 1 View  03/21/2013   CLINICAL DATA:  Altered mental status  EXAM: PORTABLE CHEST - 1 VIEW  COMPARISON:  07/25/2008  FINDINGS: Hypoaeration. Aortic tortuosity and prominence. Elevated hemidiaphragms partially obscure the heart and lung bases. Allowing for this, the heart is mildly prominent. Interstitial prominence. Mild linear left lung base opacity. Linear calcific density projecting over the left hemidiaphragm is unchanged, nonspecific. Osteopenia. No appreciable acute osseous finding.  IMPRESSION: Hypoaeration with mild left lung base opacity, favor atelectasis or scarring.  Aortic prominence/tortuosity.   Electronically Signed   By: Jearld Lesch M.D.   On: 03/21/2013 00:06      Assessment/Plan  Active Problems:   Altered mental status   Hypernatremia   Dehydration   UTI (urinary tract infection)  PLAN: Since neurologic symptoms are likely elated to her dehydration and hyponatremia,  she needs free fluid replacement; Will give IV dextrose water with potassium supplementation. Will not do MRI at this time.  Continue Rocephin for presumed UTI end results of cultures  swallowing evaluation for prognosis   Other plans as per orders.  Code Status: DO NOT RESUSCITATE  Emit Kuenzel Nocturnist [facility] Pager [phone]   03/21/2013, 1:40 AM

## 2013-03-21 NOTE — Clinical Social Work Note (Signed)
Spoke w daughters in room, want patient to return to [facility] at discharge.  No concerns expressed.  Santa Genera, LCSW Clinical Social Worker [phone])

## 2013-03-21 NOTE — Progress Notes (Signed)
Patient admitted by Dr. Orvan Falconer earlier this morning.  Patient seen and examined.  She's been admitted with change in speech and mental status. His found that she likely has a urinary tract infection and should she's been started on appropriate antibiotics. She appears to be clinically dehydrated and has been started on IV fluids. If the patient fails to improve within the next 24 hours, can consider further imaging such as MRI of her brain. We'll continue with current treatment for now. Discussed with daughters at the bedside.  Jermayne Sweeney

## 2013-03-21 NOTE — Evaluation (Signed)
Clinical/Bedside Swallow Evaluation Patient Details  Name: [name] MRN: [id number] Date of Birth: 15-May-1919  Today's Date: 03/21/2013 Time: 0150-0210 SLP Time Calculation (min): 20 min  Past Medical History:  Past Medical History  Diagnosis Date  . Dementia    Past Surgical History:  Past Surgical History  Procedure Laterality Date  . Brain surgery     HPI:  [name] is a 79 y.o. female.  Elderly Caucasian lady with advanced dementia recently had brain surgery for what sounds like a subdural hematoma, and who is working with speech therapy, was noted today be having  garbled speech. Family have confirmed a distinct and decreased use of her left side she was brought to the emergency room for evaluation. Pt was living at [facility] prior to admission. She reportedly consumed a mechanical diet with thin liquids providing max feeding assist.    Assessment / Plan / Recommendation Clinical Impression  Pt was seen for BSE today, situated upright in bed with daughters present. Initially, daughters provided brief hx of pt's hx with diet management. Pt was observed leaning to right side of bed with head tilt to right, and an open mouth posture. Pt was unable to respond to questions or cueing verbally, but was able to maintain excellent eye contact through evaluation. Though head tilt and open mouth posture were present, no drooling was observed thus pt seemed to manage own secretions. SLP was unable to elicit volitional cough or swallow from pt. Given trials of cold thin liquids by spoon (3,5 mL presentations), pt presented with overt s/sx of aspiration such as immediate cough, wet vocal quality, and facial flushing. The same results were yielded following cup presentations, with room temperature water, per daughters request. Provided small ~61mL spoon presentations of puree, pt consumed 15+ bites without overt s/sx of aspiration and/or penetration. Laryngeal elevation was weakened  at times, but pt seemed to compensate well. Pt responded best to slow rate of feeding, small bites, and upright posture at 90 degrees. SLP educated daughters regarding safe swallow techniques. SLP communicated results with nursing as well. Order placed for diet upgrade. SLP to follow up for diet tolerance and possibility of cont upgrades.     Aspiration Risk  Mild    Diet Recommendation Dysphagia 1 (Puree);Nectar-thick liquid   Liquid Administration via: Spoon;Cup Medication Administration: Crushed with puree Supervision: Staff to assist with self feeding;Trained caregiver to feed patient Compensations: Slow rate;Small sips/bites;Check for pocketing Postural Changes and/or Swallow Maneuvers: Seated upright 90 degrees;Upright 30-60 min after meal    Other  Recommendations Oral Care Recommendations: Oral care BID   Follow Up Recommendations    SLP to f/u for diet tolerance and possibility of future upgrades.    Frequency and Duration min 3x week  2 weeks   Pertinent Vitals/Pain Pt unable to report pain due to altered mental status.      Swallow Study    General Date of Onset: 03/20/13 HPI: [name] is a 79 y.o. female.  Elderly Caucasian lady with advanced dementia recently had brain surgery for what sounds like a subdural hematoma, and who is working with speech therapy, was noted today be having  garbled speech. Family have confirmed a distinct and decreased use of her left side she was brought to the emergency room for evaluation. Pt was living at [facility] prior to admission. She reportedly consumed a mechanical diet with thin liquids providing max feeding assist.  Type of Study: Bedside swallow evaluation Diet Prior  to this Study: NPO Temperature Spikes Noted: No Respiratory Status: Room air Behavior/Cognition: Lethargic;Doesn't follow directions;Hard of hearing;Pleasant mood Oral Cavity - Dentition: Edentulous Self-Feeding Abilities: Total assist Patient  Positioning: Upright in bed Baseline Vocal Quality: Low vocal intensity Volitional Cough: Cognitively unable to elicit Volitional Swallow: Unable to elicit    Oral/Motor/Sensory Function Overall Oral Motor/Sensory Function: Impaired at baseline Labial Strength: Reduced Labial Sensation: Within Functional Limits Lingual Sensation: Within Functional Limits Facial Sensation: Within Functional Limits   Ice Chips Ice chips: Not tested   Thin Liquid Thin Liquid: Impaired Presentation: Cup;Spoon Oral Phase Impairments: Reduced labial seal;Poor awareness of bolus;Impaired anterior to posterior transit Oral Phase Functional Implications: Right anterior spillage Pharyngeal  Phase Impairments: Wet Vocal Quality;Cough - Immediate    Nectar Thick Nectar Thick Liquid: Within functional limits Presentation: Spoon   Honey Thick Honey Thick Liquid: Not tested   Puree Puree: Within functional limits Presentation: Spoon   Solid   GO    Solid: Not tested       Rachell Druckenmiller S 03/21/2013,2:36 PM

## 2013-03-21 NOTE — Care Management Note (Signed)
    Page 1 of 1   03/21/2013     10:28:45 AM   CARE MANAGEMENT NOTE 03/21/2013  Patient:  [name], [name]   Account Number:  [id number]  Date Initiated:  03/21/2013  Documentation initiated by:  Sharrie Rothman  Subjective/Objective Assessment:   Pt admitted from [facility] with dehydration and UTI. Pt will return to facility at discharge.     Action/Plan:   CSW to arrange discharge to facility when medically stable.   Anticipated DC Date:  03/23/2013   Anticipated DC Plan:  SKILLED NURSING FACILITY  In-house referral  Clinical Social Worker      DC Planning Services  CM consult      Choice offered to / List presented to:             Status of service:  Completed, signed off Medicare Important Message given?   (If response is "NO", the following Medicare IM given date fields will be blank) Date Medicare IM given:   Date Additional Medicare IM given:    Discharge Disposition:  SKILLED NURSING FACILITY  Per UR Regulation:    If discussed at Long Length of Stay Meetings, dates discussed:    Comments:  03/21/13 1025 Arlyss Queen, RN BSN CM

## 2013-03-21 NOTE — Progress Notes (Signed)
Report called to RN. Pt to be transferred via bed with personal belongings. Family at bedside and aware.

## 2013-03-22 DIAGNOSIS — F0280 Dementia in other diseases classified elsewhere without behavioral disturbance: Secondary | ICD-10-CM

## 2013-03-22 LAB — BASIC METABOLIC PANEL
BUN: 9 mg/dL (ref 6–23)
Chloride: 104 mEq/L (ref 96–112)
Creatinine, Ser: 0.68 mg/dL (ref 0.50–1.10)
GFR calc Af Amer: 86 mL/min — ABNORMAL LOW (ref >90)
Glucose, Bld: 95 mg/dL (ref 70–99)
Potassium: 3.5 mEq/L (ref 3.5–5.1)

## 2013-03-22 LAB — CBC
HCT: 39.9 % (ref 36.0–46.0)
Hemoglobin: 13 g/dL (ref 12.0–15.0)
MCH: 32.5 pg (ref 26.0–34.0)
MCHC: 32.6 g/dL (ref 30.0–36.0)
MCV: 99.8 fL (ref 78.0–100.0)

## 2013-03-22 MED ORDER — MORPHINE SULFATE (CONCENTRATE) 10 MG /0.5 ML PO SOLN: 10.0000 mg | ORAL | Status: DC | PRN

## 2013-03-22 NOTE — Progress Notes (Signed)
OT Cancellation Note  Patient Details Name: [name] MRN: [id number] DOB: [birth year]   Cancelled Treatment:    Reason Eval/Treat Not Completed: OT screened, no needs identified, will sign off (son present who reports patient dependent for all self cares at nsg home and required assistance for self feeding  for some time. He states staff gets her up to recliner chair however is unaware of how much assistance patient is able to give.   recommend no skilled OT services at this time as patient at baseline for ADL tasks.)  Velora Mediate, OTR/L  03/22/2013, 12:11 PM

## 2013-03-22 NOTE — Clinical Social Work Note (Signed)
Spoke with MD regarding pt. Plan will be to d/c pt back to [facility] with comfort care. CSW updated facility and they are agreeable.  Derenda Fennel, [place] [phone]

## 2013-03-22 NOTE — Progress Notes (Signed)
[facility] PROGRESS NOTE  [name] WUX:[id number] DOB: Sep 26, 1919 DOA: 03/20/2013 PCP: Colon Branch, MD  Assessment/Plan: 1. Encephalopathy possibly due to UTI.  Patient's alteration in mental status persists.  She has advanced dementia and it is difficult to ascertain her baseline.  Her son feels that she has not quite returned to baseline yet, but also reports that her overall condition/mental status has been deteriorating over the last few months. She is minimally verbal, mostly mumbles and cannot form complete sentences and now requires assistance with meals.  She is essential bed/wheelchair bound.  He describes her quality of life to be poor.  It was noted in hospital today that her po intake has been poor, and it was noted that she was hypernatremic/dehydrated on arrival.  Although this may be related to her UTI, I also feel that progression of her dementia may be playing a role.  I do not feel that MRI would be helpful at this time, since the patient will likely not be able to tolerate laying still for a significant amount of time. Also, I do not feel that any results on MRI would change our management at this time, especially with her advanced age, poor functional status and advanced dementia.  At this time, I have recommend pursuing a comfort care approach and family is agreeable. 2. UTI, gram negative rods, continue rocephin and transition to po antibiotics tomorrow. 3. Advanced Alzheimers dementia 4. Hypernatremia, improved with free water infusion  Code Status: DNR Family Communication: discussed in detail with patient's son Disposition Plan: discharge back to [facility] with hospice services   Consultants:  none  Procedures:  none  Antibiotics:  Rocephin 12/3  HPI/Subjective: Unable to provide history due to mental status  Objective: Filed Vitals:   03/22/13 1500  BP: 131/92  Pulse: 85  Temp:   Resp: 18    Intake/Output Summary (Last 24  hours) at 03/22/13 1644 Last data filed at 03/21/13 1830  Gross per 24 hour  Intake      0 ml  Output      0 ml  Net      0 ml   Filed Weights   03/21/13 0549 03/21/13 1500 03/22/13 0443  Weight: 59.2 kg (130 lb 8.2 oz) 61.871 kg (136 lb 6.4 oz) 63.413 kg (139 lb 12.8 oz)    Exam:   General:  NAD, somnolent, does not respond to voice or follow commands  Cardiovascular: S1, S2 RRR  Respiratory: CTA B  Abdomen: soft, nt, nd, bs+  Musculoskeletal: no edema b/l   Data Reviewed: Basic Metabolic Panel:  Recent Labs Lab 03/20/13 2337 03/21/13 0629 03/22/13 0612  NA 148* 149* 140  K 3.5 3.4* 3.5  CL 111 114* 104  CO2 30 29 28   GLUCOSE 106* 87 95  BUN 22 17 9   CREATININE 0.84 0.75 0.68  CALCIUM 9.0 8.4 8.6   Liver Function Tests:  Recent Labs Lab 03/20/13 2337  AST 22  ALT 23  ALKPHOS 87  BILITOT 0.5  PROT 7.2  ALBUMIN 3.5   No results found for this basename: LIPASE, AMYLASE,  in the last 168 hours No results found for this basename: AMMONIA,  in the last 168 hours CBC:  Recent Labs Lab 03/20/13 2337 03/21/13 0629 03/22/13 0612  WBC 6.4 6.4 6.5  NEUTROABS 3.2  --   --   HGB 13.5 12.6 13.0  HCT 41.9 39.7 39.9  MCV 102.2* 102.3* 99.8  PLT 193 177 171  Cardiac Enzymes:  Recent Labs Lab 03/20/13 2337  TROPONINI <0.30   BNP (last 3 results) No results found for this basename: PROBNP,  in the last 8760 hours CBG: No results found for this basename: GLUCAP,  in the last 168 hours  Recent Results (from the past 240 hour(s))  URINE CULTURE     Status: None   Collection Time    03/21/13 12:00 AM      Result Value Range Status   Specimen Description URINE, CATHETERIZED   Final   Special Requests NONE   Final   Culture  Setup Time     Final   Value: 03/21/2013 02:00     Performed at [facility] Count     Final   Value: >=100,000 COLONIES/ML     Performed at [facility]   Culture     Final   Value: GRAM NEGATIVE  RODS     Performed at [facility]   Report Status PENDING   Incomplete  MRSA PCR SCREENING     Status: None   Collection Time    03/21/13  2:40 AM      Result Value Range Status   MRSA by PCR NEGATIVE  NEGATIVE Final   Comment:            The GeneXpert MRSA Assay (FDA     approved for NASAL specimens     only), is one component of a     comprehensive MRSA colonization     surveillance program. It is not     intended to diagnose MRSA     infection nor to guide or     monitor treatment for     MRSA infections.     Studies: Ct Head Wo Contrast  03/21/2013   CLINICAL DATA:  Altered mental status  EXAM: CT HEAD WITHOUT CONTRAST  TECHNIQUE: Contiguous axial images were obtained from the base of the skull through the vertex without intravenous contrast.  COMPARISON:  Prior CT from 03/01/2008  FINDINGS: Extensive age-related atrophy with chronic microvascular ischemic disease is seen, advanced relative to prior examination. Ventricles are prominent in size, but not out of proportion to the cortical sulci. No acute intracranial hemorrhage or infarct. No mass or midline shift. No extra-axial fluid collection. Gray-white matter differentiation is well maintained.  Sequelae of prior left frontal craniotomy is seen within the left frontal calvarium. Orbits are within normal limits. Paranasal sinuses and mastoid air cells are clear.  IMPRESSION: 1. No acute intracranial process. 2. Extensive atrophy and chronic microvascular ischemic disease, advanced relative to prior CT from 05/02/2007.   Electronically Signed   By: Rise Mu M.D.   On: 03/21/2013 00:02   Dg Chest Portable 1 View  03/21/2013   CLINICAL DATA:  Altered mental status  EXAM: PORTABLE CHEST - 1 VIEW  COMPARISON:  07/25/2008  FINDINGS: Hypoaeration. Aortic tortuosity and prominence. Elevated hemidiaphragms partially obscure the heart and lung bases. Allowing for this, the heart is mildly prominent. Interstitial prominence.  Mild linear left lung base opacity. Linear calcific density projecting over the left hemidiaphragm is unchanged, nonspecific. Osteopenia. No appreciable acute osseous finding.  IMPRESSION: Hypoaeration with mild left lung base opacity, favor atelectasis or scarring.  Aortic prominence/tortuosity.   Electronically Signed   By: Jearld Lesch M.D.   On: 03/21/2013 00:06    Scheduled Meds: . cefTRIAXone (ROCEPHIN)  IV  1 g Intravenous Q24H  . levothyroxine  75 mcg Intravenous Daily  Continuous Infusions: . dextrose 5 % with KCl 20 mEq / L 20 mEq (03/21/13 2250)    Active Problems:   Altered mental status   Hypernatremia   Dehydration   UTI (urinary tract infection)    Time spent: 45 mins, greater than 50% of time spent counseling family on hospice services    [facility]  [facility] Pager [phone]. If 7PM-7AM, please contact night-coverage at [email or web address], password [email or web address] 03/22/2013, 4:44 PM  LOS: 2 days

## 2013-03-22 NOTE — Progress Notes (Signed)
Speech Language Pathology Treatment: Dysphagia   Patient Details Name: [name] MRN: [id number] DOB: 1919/06/30 Today's Date: 03/22/2013 Time: 1191-4782 SLP Time Calculation (min): 40 min  Assessment / Plan / Recommendation Clinical Impression  Dysphagia persists in setting of dementia and stroke-like symptoms: Pt with poor trunk support and leans heavily to the right, paying little attention to left side (briefly moves her head midline to follow son's voice). She is alert and attempts to communicate unintelligibly. 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  Pt readily opens her mouth when spoon presented and she slowly consumed ice cream and half puree lunch tray. Mild/mod oral residuals (along palate) noted which clear with loose puree. Pt with decreased control with nectar thick liquids by cup and teaspoon which resulted in loose strong coughing (suspect due to premature spillage, delay in swallow initiation, and poor head control). Improved performance noted with teaspoon presentations honey-thick liquid. Although pt tolerating puree and honey-thick liquids, she continues to be at risk for aspiration given current condition. Son was present for treatment session and acknowledges her current challenges. SLP will follow, but suspect puree and honey-thick liquids would be safest diet recommendation/ new baseline.    HPI HPI: KERRILYN AZBILL is a 79 y.o. female.  Elderly Caucasian lady with advanced dementia. Family have confirmed a distinct and decreased use of her left side she was brought to the emergency room for evaluation. Pt was living at [facility] prior to admission. She reportedly consumed a mechanical diet with thin liquids providing max feeding assist.       SLP Plan  Continue with current plan of care. Liquids downgraded to honey-thick by teaspoon presentation.    Recommendations Diet recommendations: Dysphagia 1 (puree);Honey-thick liquid Liquids provided via: Teaspoon Medication Administration: Crushed with puree Supervision: Staff to assist with self feeding;Trained caregiver to feed patient Compensations: Slow rate;Small sips/bites;Check for pocketing Postural Changes and/or Swallow Maneuvers: Seated upright 90 degrees;Upright 30-60 min after meal              Oral Care Recommendations: Oral care BID;Staff/trained caregiver to provide oral care Follow up Recommendations: Skilled Nursing facility;24 hour supervision/assistance Plan: Continue with current plan of care       Thank you,  Havery Moros, CCC-SLP [phone]  Sianni Cloninger 03/22/2013, 1:42 PM

## 2013-03-23 LAB — URINE CULTURE

## 2013-03-23 MED ORDER — LORAZEPAM 2 MG/ML IJ SOLN
1.0000 mg | Freq: Once | INTRAMUSCULAR | Status: AC
  Administered 2013-03-23: 1 mg via INTRAVENOUS
  Filled 2013-03-23: qty 1

## 2013-03-23 MED ORDER — MORPHINE SULFATE (CONCENTRATE) 10 MG /0.5 ML PO SOLN: 10.0000 mg | ORAL | Status: AC | PRN

## 2013-03-23 MED ORDER — LORAZEPAM 1 MG PO TABS: 1.0000 mg | ORAL_TABLET | Freq: Four times a day (QID) | ORAL | Status: AC | PRN

## 2013-03-23 MED ORDER — ONDANSETRON HCL 4 MG PO TABS: 4.0000 mg | ORAL_TABLET | Freq: Four times a day (QID) | ORAL | Status: AC | PRN

## 2013-03-23 NOTE — Progress Notes (Signed)
Notified Dr. Kerry Hough and pts family that pts BP is low (43/29), and pt is not responding well at this time. I advised family that they may want to come be with pt at this time, daughter was very appreciative & states she is on the way. Sheryn Bison

## 2013-03-23 NOTE — Clinical Social Work Note (Signed)
CSW called son to discuss Hospice Home and he reports he is coming to hospital as pt's bp is very low. CSW called Hospice Home to cancel transfer per MD as in hospital death likely.  Derenda Fennel, [place] [phone]

## 2013-03-23 NOTE — Progress Notes (Signed)
Nurse tech called me and asked me to come assess pt because she had a large amount of foam on gown, and had apparently been foaming at the mouth. Upon entering, pt was in same position as she had been earlier, lying on her left side. She did have foam on her gown. NT had just performed oral suctioning on pt. Pt was very drowsy and barely responsive. I was told in report that pt does this periodically, especially after receiving a bath. VS were checked, and O2 was applied, pt placed on 2L and O2 came up from 80's to 91%. Pt's BP is low at this time, and Dr. Kerry Hough is aware. Dr. Kerry Hough was notified of episode and he came into room and assessed pt. Order received for 1mg  of Ativan IV now. This was administered. Pt was potential d/c today on comfort care to [facility], however at this time Dr. Kerry Hough wants [place] to monitor pt and consider d/c to [facility] if necessary. RT was consulted to see if they felt pt needed to be deep suctioned, NT suctioning is not advised at this time, but we did oral suction pt again.  I attempted to call family member listed, but was unsuccessful. Pt is resting well at this time, with no signs of seizure like activity. Sheryn Bison

## 2013-03-23 NOTE — Progress Notes (Signed)
Notified Dr. Kerry Hough that pt is impacted w/stool, some of this was cleared, but still appears slightly impacted. Considering pts current status, no new orders were received. Also notified Dr. Kerry Hough of pts BP remaining consistent w/previous VS check.  Sheryn Bison

## 2013-03-23 NOTE — Progress Notes (Signed)
Notified Dr. Kerry Hough that pts HR continues to read in the 100's to 130's range, however heart rate was irregular, so it is unclear if this is an accurate reading. No new orders at this time. Sheryn Bison

## 2013-03-23 NOTE — Discharge Summary (Signed)
Physician Discharge Summary  [name] GNF:[id number] DOB: 1919-05-05 DOA: 03/20/2013  PCP: Colon Branch, MD  Admit date: 03/20/2013 Discharge date: 03/23/2013  Time spent: 45 minutes  Recommendations for Outpatient Follow-up:  1. Patient is being discharged to residential hospice for end of life care  Discharge Diagnoses:  Active Problems:   Altered mental status   Hypernatremia   Dehydration   UTI (urinary tract infection)   Alzheimer's disease   Discharge Condition: terminal  Diet recommendation: regular diet   Filed Weights   03/21/13 0549 03/21/13 1500 03/22/13 0443  Weight: 59.2 kg (130 lb 8.2 oz) 61.871 kg (136 lb 6.4 oz) 63.413 kg (139 lb 12.8 oz)    History of present illness:  [name] is a 79 y.o. female. Elderly Caucasian lady with advanced dementia recently had brain surgery for what sounds like a subdural hematoma, and who is working with speech therapy, was noted today be having garbled speech. Family have confirmed a distinct and decreased use of her left side she was brought to the emergency room for evaluation.   Hospital Course:  This patient was admitted to the hospital with alteration in mental status and garbled speech. She was found to have a urinary tract infection and was dehydrated on admission. Patient was started on empiric Antibiotics, urine culture was positive for Escherichia coli. It is possible the patient may have an underlying CVA which caused her current presentation, although with her very advanced Alzheimer's dementia, MRI brain was not pursued since it would likely not change treatment course. The patient has poor quality of life which was confirmed by her family. She is essentially minimally verbal at baseline and requires assistance with ADLs. She is not able to have any meaningful conversation. Despite treatment of her urinary tract infection, she's had no improvement in mental status. Her by mouth intake has been minimal. At  this time I have recommended comfort care. She is now somewhat hypotensive and tachycardic. I have recommended hospice home and the family is in agreement with this. Plan will be for discharge later today.  Procedures:  none  Consultations:  none  Discharge Exam: Filed Vitals:   03/23/13 1247  BP: 80/57  Pulse: 124  Temp: 97.8 F (36.6 C)  Resp: 20    General: lethargic, unresponsive Cardiovascular: S1, S2 RRR Respiratory: bilateral crackles  Discharge Instructions  Discharge Orders   Future Orders Complete By Expires   Diet - low sodium heart healthy  As directed    Increase activity slowly  As directed        Medication List    STOP taking these medications       diazepam 5 MG tablet  Commonly known as:  VALIUM     diphenhydrAMINE 25 MG tablet  Commonly known as:  SOMINEX     folic acid 400 MCG tablet  Commonly known as:  FOLVITE     levothyroxine 150 MCG tablet  Commonly known as:  SYNTHROID, LEVOTHROID     vitamin B-12 1000 MCG tablet  Commonly known as:  CYANOCOBALAMIN     Vitamin D (Ergocalciferol) 50000 UNITS Caps capsule  Commonly known as:  DRISDOL      TAKE these medications       LORazepam 1 MG tablet  Commonly known as:  ATIVAN  Take 1 tablet (1 mg total) by mouth every 6 (six) hours as needed for anxiety.     morphine CONCENTRATE 10 mg / 0.5 ml concentrated solution  Take  0.5 mLs (10 mg total) by mouth every 2 (two) hours as needed for severe pain.     ondansetron 4 MG tablet  Commonly known as:  ZOFRAN  Take 1 tablet (4 mg total) by mouth every 6 (six) hours as needed for nausea.       No Known Allergies    The results of significant diagnostics from this hospitalization (including imaging, microbiology, ancillary and laboratory) are listed below for reference.    Significant Diagnostic Studies: Ct Head Wo Contrast  03/21/2013   CLINICAL DATA:  Altered mental status  EXAM: CT HEAD WITHOUT CONTRAST  TECHNIQUE: Contiguous  axial images were obtained from the base of the skull through the vertex without intravenous contrast.  COMPARISON:  Prior CT from 03/01/2008  FINDINGS: Extensive age-related atrophy with chronic microvascular ischemic disease is seen, advanced relative to prior examination. Ventricles are prominent in size, but not out of proportion to the cortical sulci. No acute intracranial hemorrhage or infarct. No mass or midline shift. No extra-axial fluid collection. Gray-white matter differentiation is well maintained.  Sequelae of prior left frontal craniotomy is seen within the left frontal calvarium. Orbits are within normal limits. Paranasal sinuses and mastoid air cells are clear.  IMPRESSION: 1. No acute intracranial process. 2. Extensive atrophy and chronic microvascular ischemic disease, advanced relative to prior CT from 05/02/2007.   Electronically Signed   By: Rise Mu M.D.   On: 03/21/2013 00:02   Dg Chest Portable 1 View  03/21/2013   CLINICAL DATA:  Altered mental status  EXAM: PORTABLE CHEST - 1 VIEW  COMPARISON:  07/25/2008  FINDINGS: Hypoaeration. Aortic tortuosity and prominence. Elevated hemidiaphragms partially obscure the heart and lung bases. Allowing for this, the heart is mildly prominent. Interstitial prominence. Mild linear left lung base opacity. Linear calcific density projecting over the left hemidiaphragm is unchanged, nonspecific. Osteopenia. No appreciable acute osseous finding.  IMPRESSION: Hypoaeration with mild left lung base opacity, favor atelectasis or scarring.  Aortic prominence/tortuosity.   Electronically Signed   By: Jearld Lesch M.D.   On: 03/21/2013 00:06    Microbiology: Recent Results (from the past 240 hour(s))  URINE CULTURE     Status: None   Collection Time    03/21/13 12:00 AM      Result Value Range Status   Specimen Description URINE, CATHETERIZED   Final   Special Requests NONE   Final   Culture  Setup Time     Final   Value: 03/21/2013  02:00     Performed at [facility] Count     Final   Value: >=100,000 COLONIES/ML     Performed at [facility]   Culture     Final   Value: ESCHERICHIA COLI     Performed at [facility]   Report Status 03/23/2013 FINAL   Final   Organism ID, Bacteria ESCHERICHIA COLI   Final  MRSA PCR SCREENING     Status: None   Collection Time    03/21/13  2:40 AM      Result Value Range Status   MRSA by PCR NEGATIVE  NEGATIVE Final   Comment:            The GeneXpert MRSA Assay (FDA     approved for NASAL specimens     only), is one component of a     comprehensive MRSA colonization     surveillance program. It is not     intended  to diagnose MRSA     infection nor to guide or     monitor treatment for     MRSA infections.     Labs: Basic Metabolic Panel:  Recent Labs Lab 03/20/13 2337 03/21/13 0629 03/22/13 0612  NA 148* 149* 140  K 3.5 3.4* 3.5  CL 111 114* 104  CO2 30 29 28   GLUCOSE 106* 87 95  BUN 22 17 9   CREATININE 0.84 0.75 0.68  CALCIUM 9.0 8.4 8.6   Liver Function Tests:  Recent Labs Lab 03/20/13 2337  AST 22  ALT 23  ALKPHOS 87  BILITOT 0.5  PROT 7.2  ALBUMIN 3.5   No results found for this basename: LIPASE, AMYLASE,  in the last 168 hours No results found for this basename: AMMONIA,  in the last 168 hours CBC:  Recent Labs Lab 03/20/13 2337 03/21/13 0629 03/22/13 0612  WBC 6.4 6.4 6.5  NEUTROABS 3.2  --   --   HGB 13.5 12.6 13.0  HCT 41.9 39.7 39.9  MCV 102.2* 102.3* 99.8  PLT 193 177 171   Cardiac Enzymes:  Recent Labs Lab 03/20/13 2337  TROPONINI <0.30   BNP: BNP (last 3 results) No results found for this basename: PROBNP,  in the last 8760 hours CBG: No results found for this basename: GLUCAP,  in the last 168 hours     Signed:  Kyli Sorter  [facility] 03/23/2013, 1:49 PM

## 2013-03-24 NOTE — Progress Notes (Signed)
[facility] PROGRESS NOTE  [name] WGN:[id number] DOB: 04-19-1920 DOA: 03/20/2013 PCP: Colon Branch, MD  Assessment/Plan: 1. Advanced Alzheimer's dementia with likely superimposed encephalopathy.  Etiology of encephalopathy to be due to UTI, but appears to be persisting at this time.  May just be progression of dementia.  After extensive conversation with the patient's family, decision was made for comfort care and hospice home transfer.  Transfer was cancelled yesterday since patient became hypotensive and tachycardic and in hospital death was anticipated. Since that time, patient's vital signs appear to have stabilized, although her mental status is still not responsive.  Will try and have patient moved to residential hospice over the weekend, pending bed availability.  Code Status: DNR Family Communication: discussed in detail with patient's son Disposition Plan: discharge to residential hospice, pending bed availability   Consultants:  none  Procedures:  none  Antibiotics:  Rocephin 12/3 - 12/6  HPI/Subjective: Unable to provide history due to mental status  Objective: Filed Vitals:   03/24/13 0610  BP: 113/72  Pulse: 145  Temp: 97.4 F (36.3 C)  Resp: 20   No intake or output data in the 24 hours ending 03/24/13 1507 Filed Weights   03/21/13 1500 03/22/13 0443 03/24/13 0451  Weight: 61.871 kg (136 lb 6.4 oz) 63.413 kg (139 lb 12.8 oz) 60.601 kg (133 lb 9.6 oz)    Exam:   General:  NAD, somnolent, does not respond to voice or follow commands  Cardiovascular: S1, S2 irregular  Respiratory: poor inspiratory effort, bilateral rhonchi  Abdomen: soft, nt, nd, bs+  Musculoskeletal: no edema b/l   Data Reviewed: Basic Metabolic Panel:  Recent Labs Lab 03/20/13 2337 03/21/13 0629 03/22/13 0612  NA 148* 149* 140  K 3.5 3.4* 3.5  CL 111 114* 104  CO2 30 29 28   GLUCOSE 106* 87 95  BUN 22 17 9   CREATININE 0.84 0.75 0.68  CALCIUM 9.0 8.4  8.6   Liver Function Tests:  Recent Labs Lab 03/20/13 2337  AST 22  ALT 23  ALKPHOS 87  BILITOT 0.5  PROT 7.2  ALBUMIN 3.5   No results found for this basename: LIPASE, AMYLASE,  in the last 168 hours No results found for this basename: AMMONIA,  in the last 168 hours CBC:  Recent Labs Lab 03/20/13 2337 03/21/13 0629 03/22/13 0612  WBC 6.4 6.4 6.5  NEUTROABS 3.2  --   --   HGB 13.5 12.6 13.0  HCT 41.9 39.7 39.9  MCV 102.2* 102.3* 99.8  PLT 193 177 171   Cardiac Enzymes:  Recent Labs Lab 03/20/13 2337  TROPONINI <0.30   BNP (last 3 results) No results found for this basename: PROBNP,  in the last 8760 hours CBG: No results found for this basename: GLUCAP,  in the last 168 hours  Recent Results (from the past 240 hour(s))  URINE CULTURE     Status: None   Collection Time    03/21/13 12:00 AM      Result Value Range Status   Specimen Description URINE, CATHETERIZED   Final   Special Requests NONE   Final   Culture  Setup Time     Final   Value: 03/21/2013 02:00     Performed at [facility] Count     Final   Value: >=100,000 COLONIES/ML     Performed at [facility]   Culture     Final   Value: ESCHERICHIA COLI  Performed at [facility]   Report Status 03/23/2013 FINAL   Final   Organism ID, Bacteria ESCHERICHIA COLI   Final  MRSA PCR SCREENING     Status: None   Collection Time    03/21/13  2:40 AM      Result Value Range Status   MRSA by PCR NEGATIVE  NEGATIVE Final   Comment:            The GeneXpert MRSA Assay (FDA     approved for NASAL specimens     only), is one component of a     comprehensive MRSA colonization     surveillance program. It is not     intended to diagnose MRSA     infection nor to guide or     monitor treatment for     MRSA infections.     Studies: No results found.  Scheduled Meds:   Continuous Infusions:    Active Problems:   Altered mental status   Hypernatremia    Dehydration   UTI (urinary tract infection)   Alzheimer's disease    Time spent:    [facility]  [facility] Pager [phone]. If 7PM-7AM, please contact night-coverage at [email or web address], password [facility] 03/24/2013, 3:07 PM  LOS: 4 days

## 2013-03-24 NOTE — Progress Notes (Signed)
Pt has been much more alert today than yesterday. Pt is moving spontaneously in her bed, reaching and grabbing at things. She is still not really following commands, but has opened eyes spontaneously and moved about today. [name]

## 2013-03-25 NOTE — Progress Notes (Signed)
Pt has had periods of being more alert today. Oral care provided this morning, pt was oral suctioned and then given small amt of water on sponge, while pt enjoyed this, she began coughing shortly after. Attempted to give pt pudding at dinner time tonight and she did not tolerate very well, she began gurgling/coughing shortly after. I was only able to give pt a few bites. Pt was again suctioned and biotene was used on swab and suctioned to clean pts mouth. Sheryn Bison

## 2013-03-25 NOTE — Progress Notes (Signed)
[facility] PROGRESS NOTE  [name]:[id number] DOB: [birth year] DOA: 03/20/2013 PCP: Colon Branch, MD  Assessment/Plan: 1. Advanced Alzheimer's dementia with likely superimposed encephalopathy.  Etiology of encephalopathy to be due to UTI, but appears to be persisting at this time.  May just be progression of dementia.  After extensive conversation with the patient's family, decision was made for comfort care and hospice home transfer.  Transfer was cancelled on 12/5 since patient became hypotensive and tachycardic and in hospital death was anticipated. Since that time, patient's vital signs appear to have stabilized, although her mental status is still not responsive.  Will try and have patient moved to residential hospice over the weekend, pending bed availability. In the patient's current state, I still feel that comfort care would be the most appropriate course.  Code Status: DNR Family Communication: discussed in detail with patient's son Elijah Birk [phone] Disposition Plan: discharge to residential hospice, pending bed availability   Consultants:  none  Procedures:  none  Antibiotics:  Rocephin 12/3 - 12/6  HPI/Subjective: Awake, does not respond to questions or follows commands  Objective: Filed Vitals:   03/25/13 0615  BP: 113/81  Pulse: 79  Temp: 98.3 F (36.8 C)  Resp: 16    Intake/Output Summary (Last 24 hours) at 03/25/13 0906 Last data filed at 03/25/13 0426  Gross per 24 hour  Intake      0 ml  Output      1 ml  Net     -1 ml   Filed Weights   03/22/13 0443 03/24/13 0451 03/25/13 0615  Weight: 63.413 kg (139 lb 12.8 oz) 60.601 kg (133 lb 9.6 oz) 57.924 kg (127 lb 11.2 oz)    Exam:   General:  NAD, awake, does not respond to voice or follow commands  Cardiovascular: S1, S2 regular  Respiratory: poor inspiratory effort, otherwise clear  Abdomen: soft, nt, nd, bs+  Musculoskeletal: no edema b/l   Data Reviewed: Basic Metabolic  Panel:  Recent Labs Lab 03/20/13 2337 03/21/13 0629 03/22/13 0612  NA 148* 149* 140  K 3.5 3.4* 3.5  CL 111 114* 104  CO2 30 29 28   GLUCOSE 106* 87 95  BUN 22 17 9   CREATININE 0.84 0.75 0.68  CALCIUM 9.0 8.4 8.6   Liver Function Tests:  Recent Labs Lab 03/20/13 2337  AST 22  ALT 23  ALKPHOS 87  BILITOT 0.5  PROT 7.2  ALBUMIN 3.5   No results found for this basename: LIPASE, AMYLASE,  in the last 168 hours No results found for this basename: AMMONIA,  in the last 168 hours CBC:  Recent Labs Lab 03/20/13 2337 03/21/13 0629 03/22/13 0612  WBC 6.4 6.4 6.5  NEUTROABS 3.2  --   --   HGB 13.5 12.6 13.0  HCT 41.9 39.7 39.9  MCV 102.2* 102.3* 99.8  PLT 193 177 171   Cardiac Enzymes:  Recent Labs Lab 03/20/13 2337  TROPONINI <0.30   BNP (last 3 results) No results found for this basename: PROBNP,  in the last 8760 hours CBG: No results found for this basename: GLUCAP,  in the last 168 hours  Recent Results (from the past 240 hour(s))  URINE CULTURE     Status: None   Collection Time    03/21/13 12:00 AM      Result Value Range Status   Specimen Description URINE, CATHETERIZED   Final   Special Requests NONE   Final   Culture  Setup Time  Final   Value: 03/21/2013 02:00     Performed at [facility] Count     Final   Value: >=100,000 COLONIES/ML     Performed at [facility]   Culture     Final   Value: ESCHERICHIA COLI     Performed at [facility]   Report Status 03/23/2013 FINAL   Final   Organism ID, Bacteria ESCHERICHIA COLI   Final  MRSA PCR SCREENING     Status: None   Collection Time    03/21/13  2:40 AM      Result Value Range Status   MRSA by PCR NEGATIVE  NEGATIVE Final   Comment:            The GeneXpert MRSA Assay (FDA     approved for NASAL specimens     only), is one component of a     comprehensive MRSA colonization     surveillance program. It is not     intended to diagnose MRSA      infection nor to guide or     monitor treatment for     MRSA infections.     Studies: No results found.  Scheduled Meds:   Continuous Infusions:    Active Problems:   Altered mental status   Hypernatremia   Dehydration   UTI (urinary tract infection)   Alzheimer's disease    Time spent:    [facility]  [facility] Pager [phone]. If 7PM-7AM, please contact night-coverage at [email or web address], password [facility] 03/25/2013, 9:06 AM  LOS: 5 days

## 2013-03-26 NOTE — Clinical Social Work Note (Signed)
Patient ready for discharge today, will return to [facility] w hospice involvement.  Remains on waiting list for hospice home.  Will transfer via [employer]. [name], son, notifed and agreeable.  SNF notified that patient is discharging on hospice care, admissions says they will arrange hospice consult.  Sharyl Nimrod of [facility] notified that patient should remain on waiting list for residential hospice admission, she will contact facility and family.  Discharge packet prepared and placed w shadow chart for transport.  Facility and family all agreeable to plan, no concerns expressed.  [facility] called, will arrive as soon as possible.  CSW signing off as no further SW needs identified.  Tara Genera, LCSW Clinical Social Worker [phone])

## 2013-03-26 NOTE — Discharge Summary (Signed)
Physician Discharge Summary  [name] ZOX:[id number] DOB: June 20, 1919 DOA: 03/20/2013  PCP: Colon Branch, MD  Admit date: 03/20/2013 Discharge date: 03/26/2013  Time spent: 45 minutes  Recommendations for Outpatient Follow-up:  1. Patient will be discharged back to [facility] with hospice services to follow for end of life care  Discharge Diagnoses:  Active Problems:   Altered mental status   Hypernatremia   Dehydration   UTI (urinary tract infection)   Alzheimer's disease   Discharge Condition: terminal  Diet recommendation: regular diet for comfort  Filed Weights   03/24/13 0451 03/25/13 0615 03/26/13 0630  Weight: 60.601 kg (133 lb 9.6 oz) 57.924 kg (127 lb 11.2 oz) 58.333 kg (128 lb 9.6 oz)    History of present illness:  [name] is a 79 y.o. female. Elderly Caucasian lady with advanced dementia recently had brain surgery for what sounds like a subdural hematoma, and who is working with speech therapy, was noted today be having garbled speech. Family have confirmed a distinct and decreased use of her left side she was brought to the emergency room for evaluation.   Hospital Course:  This patient was admitted to the hospital with alteration in mental status and garbled speech. She was found to have a urinary tract infection and was dehydrated on admission. Patient was started on empiric Antibiotics, urine culture was positive for Escherichia coli. It is possible the patient may have an underlying CVA which caused her current presentation, although with her very advanced Alzheimer's dementia, MRI brain was not pursued since it would likely not change treatment course. The patient has poor quality of life which was confirmed by her family. She is essentially minimally verbal at baseline and requires assistance with ADLs. She is not able to have any meaningful conversation. Despite treatment of her urinary tract infection, she's had no improvement in mental status.  Her by mouth intake has been minimal and she remains nonverbal. At this time I have recommended comfort care and focus on quality of life. Patient had become hypotensive and tachycardic, so patient's transfer to residential hospice was canceled. Over the weekend, it appears that her vitals has stabilized.  Her po intake remains minimal and she is nonverbal, therefore I still believe her longterm prognosis is poor.  Family has agreed to take her back to [facility] with hosp[facility] services. Plan will be for discharge later today.  Procedures:  none  Consultations:  none  Discharge Exam: Filed Vitals:   03/26/13 0630  BP: 129/84  Pulse: 88  Temp: 98 F (36.7 C)  Resp: 16    General: awake, nonverbal Cardiovascular: S1, S2 RRR Respiratory: CTA B  Discharge Instructions      Discharge Orders   Future Orders Complete By Expires   Diet - low sodium heart healthy  As directed    Increase activity slowly  As directed        Medication List    STOP taking these medications       diazepam 5 MG tablet  Commonly known as:  VALIUM     diphenhydrAMINE 25 MG tablet  Commonly known as:  SOMINEX     folic acid 400 MCG tablet  Commonly known as:  FOLVITE     levothyroxine 150 MCG tablet  Commonly known as:  SYNTHROID, LEVOTHROID     vitamin B-12 1000 MCG tablet  Commonly known as:  CYANOCOBALAMIN     Vitamin D (Ergocalciferol) 50000 UNITS Caps capsule  Commonly  known as:  DRISDOL      TAKE these medications       LORazepam 1 MG tablet  Commonly known as:  ATIVAN  Take 1 tablet (1 mg total) by mouth every 6 (six) hours as needed for anxiety.     morphine CONCENTRATE 10 mg / 0.5 ml concentrated solution  Take 0.5 mLs (10 mg total) by mouth every 2 (two) hours as needed for severe pain.     ondansetron 4 MG tablet  Commonly known as:  ZOFRAN  Take 1 tablet (4 mg total) by mouth every 6 (six) hours as needed for nausea.       No Known Allergies    The  results of significant diagnostics from this hospitalization (including imaging, microbiology, ancillary and laboratory) are listed below for reference.    Significant Diagnostic Studies: Ct Head Wo Contrast  03/21/2013   CLINICAL DATA:  Altered mental status  EXAM: CT HEAD WITHOUT CONTRAST  TECHNIQUE: Contiguous axial images were obtained from the base of the skull through the vertex without intravenous contrast.  COMPARISON:  Prior CT from 03/01/2008  FINDINGS: Extensive age-related atrophy with chronic microvascular ischemic disease is seen, advanced relative to prior examination. Ventricles are prominent in size, but not out of proportion to the cortical sulci. No acute intracranial hemorrhage or infarct. No mass or midline shift. No extra-axial fluid collection. Gray-white matter differentiation is well maintained.  Sequelae of prior left frontal craniotomy is seen within the left frontal calvarium. Orbits are within normal limits. Paranasal sinuses and mastoid air cells are clear.  IMPRESSION: 1. No acute intracranial process. 2. Extensive atrophy and chronic microvascular ischemic disease, advanced relative to prior CT from 05/02/2007.   Electronically Signed   By: Rise Mu M.D.   On: 03/21/2013 00:02   Dg Chest Portable 1 View  03/21/2013   CLINICAL DATA:  Altered mental status  EXAM: PORTABLE CHEST - 1 VIEW  COMPARISON:  07/25/2008  FINDINGS: Hypoaeration. Aortic tortuosity and prominence. Elevated hemidiaphragms partially obscure the heart and lung bases. Allowing for this, the heart is mildly prominent. Interstitial prominence. Mild linear left lung base opacity. Linear calcific density projecting over the left hemidiaphragm is unchanged, nonspecific. Osteopenia. No appreciable acute osseous finding.  IMPRESSION: Hypoaeration with mild left lung base opacity, favor atelectasis or scarring.  Aortic prominence/tortuosity.   Electronically Signed   By: Jearld Lesch M.D.   On:  03/21/2013 00:06    Microbiology: Recent Results (from the past 240 hour(s))  URINE CULTURE     Status: None   Collection Time    03/21/13 12:00 AM      Result Value Range Status   Specimen Description URINE, CATHETERIZED   Final   Special Requests NONE   Final   Culture  Setup Time     Final   Value: 03/21/2013 02:00     Performed at [facility] Count     Final   Value: >=100,000 COLONIES/ML     Performed at [facility]   Culture     Final   Value: ESCHERICHIA COLI     Performed at [facility]   Report Status 03/23/2013 FINAL   Final   Organism ID, Bacteria ESCHERICHIA COLI   Final  MRSA PCR SCREENING     Status: None   Collection Time    03/21/13  2:40 AM      Result Value Range Status   MRSA by PCR NEGATIVE  NEGATIVE Final   Comment:            The GeneXpert MRSA Assay (FDA     approved for NASAL specimens     only), is one component of a     comprehensive MRSA colonization     surveillance program. It is not     intended to diagnose MRSA     infection nor to guide or     monitor treatment for     MRSA infections.     Labs: Basic Metabolic Panel:  Recent Labs Lab 03/20/13 2337 03/21/13 0629 03/22/13 0612  NA 148* 149* 140  K 3.5 3.4* 3.5  CL 111 114* 104  CO2 30 29 28   GLUCOSE 106* 87 95  BUN 22 17 9   CREATININE 0.84 0.75 0.68  CALCIUM 9.0 8.4 8.6   Liver Function Tests:  Recent Labs Lab 03/20/13 2337  AST 22  ALT 23  ALKPHOS 87  BILITOT 0.5  PROT 7.2  ALBUMIN 3.5   No results found for this basename: LIPASE, AMYLASE,  in the last 168 hours No results found for this basename: AMMONIA,  in the last 168 hours CBC:  Recent Labs Lab 03/20/13 2337 03/21/13 0629 03/22/13 0612  WBC 6.4 6.4 6.5  NEUTROABS 3.2  --   --   HGB 13.5 12.6 13.0  HCT 41.9 39.7 39.9  MCV 102.2* 102.3* 99.8  PLT 193 177 171   Cardiac Enzymes:  Recent Labs Lab 03/20/13 2337  TROPONINI <0.30   BNP: BNP (last 3 results) No  results found for this basename: PROBNP,  in the last 8760 hours CBG: No results found for this basename: GLUCAP,  in the last 168 hours     Signed:  MEMON,JEHANZEB  [facility] 03/26/2013, 11:52 AM

## 2013-03-26 NOTE — Progress Notes (Signed)
Called to given report at [facility] to speak with someone at this time.Will attempt to call again later.

## 2013-04-19 DEATH — deceased

## 2013-05-07 ENCOUNTER — Telehealth: Payer: Self-pay

## 2013-05-07 NOTE — Telephone Encounter (Signed)
[facility] per Ileene [facility]insonbituary in [facility]
